# Patient Record
Sex: Female | Born: 1985 | Race: Black or African American | Hispanic: No | Marital: Single | Smoking: Former smoker
Health system: Southern US, Community
[De-identification: ages and names within clinical notes are randomized; demographics above are authoritative.]

## PROBLEM LIST (undated history)

## (undated) DIAGNOSIS — B9689 Other specified bacterial agents as the cause of diseases classified elsewhere: Secondary | ICD-10-CM

## (undated) DIAGNOSIS — G43909 Migraine, unspecified, not intractable, without status migrainosus: Secondary | ICD-10-CM

## (undated) DIAGNOSIS — Z9109 Other allergy status, other than to drugs and biological substances: Secondary | ICD-10-CM

## (undated) DIAGNOSIS — A749 Chlamydial infection, unspecified: Secondary | ICD-10-CM

## (undated) DIAGNOSIS — B3731 Acute candidiasis of vulva and vagina: Secondary | ICD-10-CM

## (undated) DIAGNOSIS — B373 Candidiasis of vulva and vagina: Secondary | ICD-10-CM

## (undated) DIAGNOSIS — N760 Acute vaginitis: Secondary | ICD-10-CM

## (undated) DIAGNOSIS — A549 Gonococcal infection, unspecified: Secondary | ICD-10-CM

## (undated) DIAGNOSIS — I10 Essential (primary) hypertension: Secondary | ICD-10-CM

## (undated) DIAGNOSIS — R87619 Unspecified abnormal cytological findings in specimens from cervix uteri: Secondary | ICD-10-CM

## (undated) DIAGNOSIS — IMO0002 Reserved for concepts with insufficient information to code with codable children: Secondary | ICD-10-CM

---
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
## 2007-03-06 ENCOUNTER — Emergency Department (HOSPITAL_COMMUNITY): Admission: EM | Admit: 2007-03-06 | Discharge: 2007-03-06 | Payer: Self-pay | Admitting: Family Medicine

## 2007-03-09 ENCOUNTER — Inpatient Hospital Stay (HOSPITAL_COMMUNITY): Admission: AD | Admit: 2007-03-09 | Discharge: 2007-03-09 | Payer: Self-pay | Admitting: Obstetrics and Gynecology

## 2007-11-01 ENCOUNTER — Inpatient Hospital Stay (HOSPITAL_COMMUNITY): Admission: AD | Admit: 2007-11-01 | Discharge: 2007-11-01 | Payer: Self-pay | Admitting: Obstetrics & Gynecology

## 2007-11-01 ENCOUNTER — Ambulatory Visit: Payer: Self-pay | Admitting: Advanced Practice Midwife

## 2008-02-14 ENCOUNTER — Emergency Department (HOSPITAL_COMMUNITY): Admission: EM | Admit: 2008-02-14 | Discharge: 2008-02-14 | Payer: Self-pay | Admitting: Emergency Medicine

## 2008-07-15 ENCOUNTER — Emergency Department (HOSPITAL_COMMUNITY): Admission: EM | Admit: 2008-07-15 | Discharge: 2008-07-15 | Payer: Self-pay | Admitting: Emergency Medicine

## 2009-01-01 ENCOUNTER — Emergency Department (HOSPITAL_COMMUNITY): Admission: EM | Admit: 2009-01-01 | Discharge: 2009-01-01 | Payer: Self-pay | Admitting: Family Medicine

## 2009-01-06 ENCOUNTER — Inpatient Hospital Stay (HOSPITAL_COMMUNITY): Admission: AD | Admit: 2009-01-06 | Discharge: 2009-01-06 | Payer: Self-pay | Admitting: Obstetrics & Gynecology

## 2009-02-04 ENCOUNTER — Emergency Department (HOSPITAL_COMMUNITY): Admission: EM | Admit: 2009-02-04 | Discharge: 2009-02-04 | Payer: Self-pay | Admitting: Emergency Medicine

## 2009-03-30 ENCOUNTER — Emergency Department (HOSPITAL_COMMUNITY): Admission: EM | Admit: 2009-03-30 | Discharge: 2009-03-30 | Payer: Self-pay | Admitting: Family Medicine

## 2009-04-30 ENCOUNTER — Emergency Department (HOSPITAL_COMMUNITY): Admission: EM | Admit: 2009-04-30 | Discharge: 2009-04-30 | Payer: Self-pay | Admitting: Family Medicine

## 2009-05-24 ENCOUNTER — Emergency Department (HOSPITAL_COMMUNITY): Admission: EM | Admit: 2009-05-24 | Discharge: 2009-05-24 | Payer: Self-pay | Admitting: Emergency Medicine

## 2009-09-02 ENCOUNTER — Emergency Department (HOSPITAL_COMMUNITY): Admission: EM | Admit: 2009-09-02 | Discharge: 2009-09-02 | Payer: Self-pay | Admitting: Emergency Medicine

## 2010-03-01 ENCOUNTER — Emergency Department (HOSPITAL_COMMUNITY): Admission: EM | Admit: 2010-03-01 | Discharge: 2010-03-01 | Payer: Self-pay | Admitting: Family Medicine

## 2010-03-04 ENCOUNTER — Emergency Department (HOSPITAL_COMMUNITY): Admission: EM | Admit: 2010-03-04 | Discharge: 2010-03-04 | Payer: Self-pay | Admitting: Family Medicine

## 2010-03-31 ENCOUNTER — Emergency Department (HOSPITAL_COMMUNITY): Admission: EM | Admit: 2010-03-31 | Discharge: 2010-03-31 | Payer: Self-pay | Admitting: Family Medicine

## 2010-06-18 ENCOUNTER — Emergency Department (HOSPITAL_COMMUNITY): Admission: EM | Admit: 2010-06-18 | Discharge: 2010-06-18 | Payer: Self-pay | Admitting: Family Medicine

## 2010-07-26 ENCOUNTER — Emergency Department (HOSPITAL_COMMUNITY)
Admission: EM | Admit: 2010-07-26 | Discharge: 2010-07-26 | Payer: Self-pay | Source: Home / Self Care | Admitting: Emergency Medicine

## 2010-09-01 ENCOUNTER — Emergency Department (HOSPITAL_COMMUNITY)
Admission: EM | Admit: 2010-09-01 | Discharge: 2010-09-01 | Payer: Self-pay | Source: Home / Self Care | Admitting: Emergency Medicine

## 2010-09-03 ENCOUNTER — Emergency Department (HOSPITAL_COMMUNITY)
Admission: EM | Admit: 2010-09-03 | Discharge: 2010-09-03 | Payer: Self-pay | Source: Home / Self Care | Admitting: Family Medicine

## 2010-09-03 LAB — POCT URINALYSIS DIPSTICK
Specific Gravity, Urine: 1.02 (ref 1.005–1.030)
Urine Glucose, Fasting: NEGATIVE mg/dL
Urobilinogen, UA: 1 mg/dL (ref 0.0–1.0)

## 2010-09-03 LAB — WET PREP, GENITAL

## 2010-09-03 LAB — POCT PREGNANCY, URINE: Preg Test, Ur: NEGATIVE

## 2010-09-30 ENCOUNTER — Inpatient Hospital Stay (INDEPENDENT_AMBULATORY_CARE_PROVIDER_SITE_OTHER)
Admission: RE | Admit: 2010-09-30 | Discharge: 2010-09-30 | Disposition: A | Discharge status: Home / Self Care | Payer: Self-pay | Source: Ambulatory Visit | Attending: Family Medicine | Admitting: Family Medicine

## 2010-09-30 DIAGNOSIS — A499 Bacterial infection, unspecified: Secondary | ICD-10-CM

## 2010-09-30 DIAGNOSIS — N760 Acute vaginitis: Secondary | ICD-10-CM

## 2010-09-30 LAB — POCT URINALYSIS DIPSTICK
Bilirubin Urine: NEGATIVE
Ketones, ur: NEGATIVE mg/dL
Nitrite: NEGATIVE
Specific Gravity, Urine: 1.02 (ref 1.005–1.030)
pH: 5.5 (ref 5.0–8.0)

## 2010-09-30 LAB — WET PREP, GENITAL: Trich, Wet Prep: NONE SEEN

## 2010-10-01 LAB — GC/CHLAMYDIA PROBE AMP, GENITAL
Chlamydia, DNA Probe: NEGATIVE
GC Probe Amp, Genital: NEGATIVE

## 2010-10-19 LAB — POCT URINALYSIS DIPSTICK
Nitrite: NEGATIVE
Protein, ur: NEGATIVE mg/dL
Urobilinogen, UA: 0.2 mg/dL (ref 0.0–1.0)
pH: 6 (ref 5.0–8.0)

## 2010-10-19 LAB — POCT PREGNANCY, URINE: Preg Test, Ur: NEGATIVE

## 2010-10-19 LAB — WET PREP, GENITAL
Trich, Wet Prep: NONE SEEN
Yeast Wet Prep HPF POC: NONE SEEN

## 2010-10-22 LAB — URINE CULTURE

## 2010-10-22 LAB — POCT URINALYSIS DIPSTICK
Bilirubin Urine: NEGATIVE
Glucose, UA: NEGATIVE mg/dL
Nitrite: NEGATIVE
Urobilinogen, UA: 0.2 mg/dL (ref 0.0–1.0)

## 2010-10-23 LAB — POCT PREGNANCY, URINE
Preg Test, Ur: NEGATIVE
Preg Test, Ur: NEGATIVE

## 2010-10-23 LAB — POCT URINALYSIS DIP (DEVICE)
Bilirubin Urine: NEGATIVE
Glucose, UA: NEGATIVE mg/dL
Hgb urine dipstick: NEGATIVE
Nitrite: NEGATIVE
Urobilinogen, UA: 1 mg/dL (ref 0.0–1.0)

## 2010-10-23 LAB — WET PREP, GENITAL
Trich, Wet Prep: NONE SEEN
Yeast Wet Prep HPF POC: NONE SEEN

## 2010-10-23 LAB — GC/CHLAMYDIA PROBE AMP, GENITAL: GC Probe Amp, Genital: NEGATIVE

## 2010-10-24 LAB — RPR: RPR Ser Ql: NONREACTIVE

## 2010-10-24 LAB — POCT URINALYSIS DIP (DEVICE)
Bilirubin Urine: NEGATIVE
Ketones, ur: NEGATIVE mg/dL

## 2010-10-24 LAB — POCT PREGNANCY, URINE: Preg Test, Ur: NEGATIVE

## 2010-10-24 LAB — WET PREP, GENITAL

## 2010-10-24 LAB — GC/CHLAMYDIA PROBE AMP, GENITAL: GC Probe Amp, Genital: NEGATIVE

## 2010-10-31 ENCOUNTER — Emergency Department (HOSPITAL_COMMUNITY)
Admission: EM | Admit: 2010-10-31 | Discharge: 2010-10-31 | Disposition: A | Discharge status: Home / Self Care | Payer: Medicaid Other | Attending: Emergency Medicine | Admitting: Emergency Medicine

## 2010-10-31 DIAGNOSIS — S61209A Unspecified open wound of unspecified finger without damage to nail, initial encounter: Secondary | ICD-10-CM | POA: Insufficient documentation

## 2010-11-04 ENCOUNTER — Inpatient Hospital Stay (INDEPENDENT_AMBULATORY_CARE_PROVIDER_SITE_OTHER)
Admission: RE | Admit: 2010-11-04 | Discharge: 2010-11-04 | Disposition: A | Discharge status: Home / Self Care | Payer: Self-pay | Source: Ambulatory Visit | Attending: Family Medicine | Admitting: Family Medicine

## 2010-11-04 DIAGNOSIS — N760 Acute vaginitis: Secondary | ICD-10-CM

## 2010-11-04 LAB — POCT URINALYSIS DIP (DEVICE)
Bilirubin Urine: NEGATIVE
Bilirubin Urine: NEGATIVE
Glucose, UA: NEGATIVE mg/dL
Hgb urine dipstick: NEGATIVE
Ketones, ur: NEGATIVE mg/dL
Ketones, ur: NEGATIVE mg/dL
pH: 7 (ref 5.0–8.0)
pH: 7 (ref 5.0–8.0)

## 2010-11-04 LAB — WET PREP, GENITAL: Trich, Wet Prep: NONE SEEN

## 2010-11-11 LAB — WET PREP, GENITAL: WBC, Wet Prep HPF POC: NONE SEEN

## 2010-11-11 LAB — POCT URINALYSIS DIP (DEVICE)
Glucose, UA: NEGATIVE mg/dL
Hgb urine dipstick: NEGATIVE
Nitrite: NEGATIVE
Protein, ur: NEGATIVE mg/dL
Urobilinogen, UA: 1 mg/dL (ref 0.0–1.0)
pH: 8.5 — ABNORMAL HIGH (ref 5.0–8.0)

## 2010-11-11 LAB — POCT PREGNANCY, URINE: Preg Test, Ur: NEGATIVE

## 2010-11-12 LAB — POCT URINALYSIS DIP (DEVICE)
Bilirubin Urine: NEGATIVE
Glucose, UA: NEGATIVE mg/dL
Ketones, ur: NEGATIVE mg/dL
Nitrite: NEGATIVE

## 2010-11-12 LAB — POCT PREGNANCY, URINE: Preg Test, Ur: NEGATIVE

## 2010-11-12 LAB — WET PREP, GENITAL
WBC, Wet Prep HPF POC: NONE SEEN
Yeast Wet Prep HPF POC: NONE SEEN

## 2010-11-12 LAB — GC/CHLAMYDIA PROBE AMP, GENITAL: Chlamydia, DNA Probe: POSITIVE — AB

## 2010-11-13 LAB — POCT URINALYSIS DIP (DEVICE)
Bilirubin Urine: NEGATIVE
Glucose, UA: NEGATIVE mg/dL
Nitrite: NEGATIVE
pH: 6.5 (ref 5.0–8.0)

## 2010-11-13 LAB — GC/CHLAMYDIA PROBE AMP, GENITAL: Chlamydia, DNA Probe: NEGATIVE

## 2010-11-13 LAB — POCT PREGNANCY, URINE: Preg Test, Ur: NEGATIVE

## 2010-11-13 LAB — WET PREP, GENITAL: Trich, Wet Prep: NONE SEEN

## 2010-11-15 LAB — URINALYSIS, ROUTINE W REFLEX MICROSCOPIC
Protein, ur: 100 mg/dL — AB
Urobilinogen, UA: 0.2 mg/dL (ref 0.0–1.0)

## 2010-11-15 LAB — POCT URINALYSIS DIP (DEVICE)
Bilirubin Urine: NEGATIVE
Ketones, ur: NEGATIVE mg/dL
pH: 6 (ref 5.0–8.0)

## 2010-11-15 LAB — CBC
HCT: 38.5 % (ref 36.0–46.0)
MCV: 87.3 fL (ref 78.0–100.0)
Platelets: 253 10*3/uL (ref 150–400)
WBC: 7.5 10*3/uL (ref 4.0–10.5)

## 2010-11-15 LAB — WET PREP, GENITAL: Trich, Wet Prep: NONE SEEN

## 2010-11-15 LAB — URINE MICROSCOPIC-ADD ON

## 2010-11-15 LAB — GC/CHLAMYDIA PROBE AMP, GENITAL: GC Probe Amp, Genital: NEGATIVE

## 2010-11-16 LAB — POCT URINALYSIS DIP (DEVICE)
Hgb urine dipstick: NEGATIVE
Ketones, ur: NEGATIVE mg/dL
Nitrite: NEGATIVE
pH: 5.5 (ref 5.0–8.0)

## 2010-11-16 LAB — WET PREP, GENITAL
Trich, Wet Prep: NONE SEEN
Yeast Wet Prep HPF POC: NONE SEEN

## 2010-11-16 LAB — GC/CHLAMYDIA PROBE AMP, GENITAL: Chlamydia, DNA Probe: NEGATIVE

## 2010-12-24 NOTE — Group Therapy Note (Signed)
Jillian Rowland, Jillian Rowland            ACCOUNT NO.:  [id number]   MEDICAL RECORD NO.:  [id number]          PATIENT TYPE:  WOC   LOCATION:  [facility]                   FACILITY:  [facility]   PHYSICIAN:  Tinnie Gens, MD        DATE OF BIRTH:  June 19, 1986   DATE OF SERVICE:  09/03/2004                                    CLINIC NOTE   CHIEF COMPLAINT:  Referral for amenorrhea.   HISTORY OF PRESENT ILLNESS:  Patient is a 25 year old G0 who is referred  from the health department.  She was seen there on December 12 for birth  control consult.  However, she had irregular cycles and she was referred  here for that.  According to the patient she had menarche in July of 2005  for the first time with normal breast development.  Her next cycle was in  September of 2005 and she had one in December of 2005.  She had yet to have  one in January.   PAST MEDICAL HISTORY:  Negative.   PAST SURGICAL HISTORY:  Negative.   PAST OBSTETRICAL HISTORY:  G0.   PAST GYNECOLOGICAL HISTORY:  No abnormal Pap smear.  First intercourse age  20.   MEDICATIONS:  None.   ALLERGIES:  None known.   FAMILY HISTORY:  Diabetes, hypertension, breast cancer.   SOCIAL HISTORY:  No tobacco, alcohol, or drug use.  Still student.  She does  have occasional headaches.   PHYSICAL EXAMINATION:  GENERAL:  She is a well-developed, well-nourished,  typical looking female.  VITAL SIGNS:  Stated in the chart.  ABDOMEN:  Soft, nontender, nondistended.   IMPRESSION:  1.  Oligomenorrhea likely related to this being the first year of her      cycles.  This is not unusual for kids to have irregular cycles for the      first year after menarche.  Otherwise, normal pubertal development.  2.  Birth control consult.   PLAN:  I believe this is normal for her.  She has no other symptoms.  She  does need birth control.  Have started her on the ring.  She was instructed  about its usage and how to obtain it.  Patient will go back to the  health  department  for assistance with the NuvaRing.  When the patient comes off birth control  should she continue to have irregular cycles, will be happy to see her back  for that.      TP/MEDQ  D:  09/03/2004  T:  09/03/2004  Job:  [id number]

## 2011-01-30 ENCOUNTER — Inpatient Hospital Stay (INDEPENDENT_AMBULATORY_CARE_PROVIDER_SITE_OTHER)
Admission: RE | Admit: 2011-01-30 | Discharge: 2011-01-30 | Disposition: A | Discharge status: Home / Self Care | Payer: Self-pay | Source: Ambulatory Visit | Attending: Emergency Medicine | Admitting: Emergency Medicine

## 2011-01-30 DIAGNOSIS — L740 Miliaria rubra: Secondary | ICD-10-CM

## 2011-01-30 DIAGNOSIS — N760 Acute vaginitis: Secondary | ICD-10-CM

## 2011-01-30 LAB — POCT PREGNANCY, URINE: Preg Test, Ur: NEGATIVE

## 2011-01-30 LAB — POCT URINALYSIS DIP (DEVICE)
Leukocytes, UA: NEGATIVE
Nitrite: POSITIVE — AB
Protein, ur: NEGATIVE mg/dL
Urobilinogen, UA: 1 mg/dL (ref 0.0–1.0)

## 2011-02-01 LAB — GC/CHLAMYDIA PROBE AMP, GENITAL
Chlamydia, DNA Probe: NEGATIVE
GC Probe Amp, Genital: NEGATIVE

## 2011-02-21 ENCOUNTER — Inpatient Hospital Stay (INDEPENDENT_AMBULATORY_CARE_PROVIDER_SITE_OTHER)
Admission: RE | Admit: 2011-02-21 | Discharge: 2011-02-21 | Disposition: A | Discharge status: Home / Self Care | Payer: Medicaid Other | Source: Ambulatory Visit | Attending: Family Medicine | Admitting: Family Medicine

## 2011-02-21 DIAGNOSIS — H109 Unspecified conjunctivitis: Secondary | ICD-10-CM

## 2011-03-27 ENCOUNTER — Inpatient Hospital Stay (INDEPENDENT_AMBULATORY_CARE_PROVIDER_SITE_OTHER)
Admission: RE | Admit: 2011-03-27 | Discharge: 2011-03-27 | Disposition: A | Discharge status: Home / Self Care | Payer: Medicaid Other | Source: Ambulatory Visit | Attending: Emergency Medicine | Admitting: Emergency Medicine

## 2011-03-27 DIAGNOSIS — N760 Acute vaginitis: Secondary | ICD-10-CM

## 2011-03-27 LAB — WET PREP, GENITAL
Clue Cells Wet Prep HPF POC: NONE SEEN
Trich, Wet Prep: NONE SEEN

## 2011-03-27 LAB — POCT URINALYSIS DIP (DEVICE)
Bilirubin Urine: NEGATIVE
Ketones, ur: NEGATIVE mg/dL
Nitrite: NEGATIVE
Protein, ur: NEGATIVE mg/dL

## 2011-03-27 LAB — POCT PREGNANCY, URINE: Preg Test, Ur: NEGATIVE

## 2011-03-28 LAB — GC/CHLAMYDIA PROBE AMP, GENITAL: GC Probe Amp, Genital: NEGATIVE

## 2011-05-02 ENCOUNTER — Inpatient Hospital Stay (INDEPENDENT_AMBULATORY_CARE_PROVIDER_SITE_OTHER)
Admission: RE | Admit: 2011-05-02 | Discharge: 2011-05-02 | Disposition: A | Discharge status: Home / Self Care | Payer: Medicaid Other | Source: Ambulatory Visit | Attending: Family Medicine | Admitting: Family Medicine

## 2011-05-02 DIAGNOSIS — N760 Acute vaginitis: Secondary | ICD-10-CM

## 2011-05-02 DIAGNOSIS — A499 Bacterial infection, unspecified: Secondary | ICD-10-CM

## 2011-05-02 LAB — POCT URINALYSIS DIP (DEVICE)
Bilirubin Urine: NEGATIVE
Glucose, UA: NEGATIVE mg/dL
Ketones, ur: NEGATIVE mg/dL
Leukocytes, UA: NEGATIVE
Nitrite: NEGATIVE

## 2011-05-02 LAB — WET PREP, GENITAL

## 2011-05-03 LAB — GC/CHLAMYDIA PROBE AMP, GENITAL: GC Probe Amp, Genital: NEGATIVE

## 2011-05-05 LAB — WET PREP, GENITAL
WBC, Wet Prep HPF POC: NONE SEEN
Yeast Wet Prep HPF POC: NONE SEEN

## 2011-05-05 LAB — POCT URINALYSIS DIP (DEVICE)
Bilirubin Urine: NEGATIVE
Hgb urine dipstick: NEGATIVE
Nitrite: NEGATIVE
Specific Gravity, Urine: 1.015
pH: 6.5

## 2011-05-05 LAB — POCT PREGNANCY, URINE: Preg Test, Ur: NEGATIVE

## 2011-05-05 LAB — POCT RAPID STREP A: Streptococcus, Group A Screen (Direct): NEGATIVE

## 2011-05-13 LAB — HERPES SIMPLEX VIRUS CULTURE

## 2011-05-13 LAB — GC/CHLAMYDIA PROBE AMP, GENITAL
Chlamydia, DNA Probe: NEGATIVE
GC Probe Amp, Genital: NEGATIVE

## 2011-05-13 LAB — WET PREP, GENITAL

## 2011-05-13 LAB — RPR: RPR Ser Ql: NONREACTIVE

## 2011-05-13 LAB — POCT URINALYSIS DIP (DEVICE)
Glucose, UA: NEGATIVE mg/dL
Nitrite: NEGATIVE
Specific Gravity, Urine: 1.015 (ref 1.005–1.030)
Urobilinogen, UA: 0.2 mg/dL (ref 0.0–1.0)

## 2011-05-13 LAB — POCT PREGNANCY, URINE: Preg Test, Ur: NEGATIVE

## 2011-05-13 LAB — HIV ANTIBODY (ROUTINE TESTING W REFLEX): HIV: NONREACTIVE

## 2011-05-23 LAB — URINALYSIS, ROUTINE W REFLEX MICROSCOPIC
Bilirubin Urine: NEGATIVE
Glucose, UA: NEGATIVE
Hgb urine dipstick: NEGATIVE
Ketones, ur: NEGATIVE
Protein, ur: NEGATIVE
pH: 7

## 2011-05-23 LAB — WET PREP, GENITAL

## 2011-05-23 LAB — GC/CHLAMYDIA PROBE AMP, GENITAL
Chlamydia, DNA Probe: NEGATIVE
GC Probe Amp, Genital: NEGATIVE

## 2011-05-23 LAB — POCT URINALYSIS DIP (DEVICE)
Bilirubin Urine: NEGATIVE
Nitrite: NEGATIVE
Protein, ur: NEGATIVE
pH: 6

## 2011-05-23 LAB — POCT PREGNANCY, URINE
Operator id: 235561
Preg Test, Ur: NEGATIVE

## 2011-05-31 ENCOUNTER — Inpatient Hospital Stay (INDEPENDENT_AMBULATORY_CARE_PROVIDER_SITE_OTHER)
Admission: RE | Admit: 2011-05-31 | Discharge: 2011-05-31 | Disposition: A | Discharge status: Home / Self Care | Payer: Medicaid Other | Source: Ambulatory Visit | Attending: Emergency Medicine | Admitting: Emergency Medicine

## 2011-05-31 DIAGNOSIS — J020 Streptococcal pharyngitis: Secondary | ICD-10-CM

## 2011-05-31 DIAGNOSIS — N912 Amenorrhea, unspecified: Secondary | ICD-10-CM

## 2011-05-31 LAB — POCT RAPID STREP A: Streptococcus, Group A Screen (Direct): POSITIVE — AB

## 2011-05-31 LAB — POCT PREGNANCY, URINE: Preg Test, Ur: NEGATIVE

## 2011-06-15 ENCOUNTER — Emergency Department (HOSPITAL_COMMUNITY): Payer: Medicaid Other

## 2011-06-15 ENCOUNTER — Emergency Department (HOSPITAL_COMMUNITY)
Admission: EM | Admit: 2011-06-15 | Discharge: 2011-06-15 | Disposition: A | Discharge status: Home / Self Care | Payer: Medicaid Other | Attending: Emergency Medicine | Admitting: Emergency Medicine

## 2011-06-15 ENCOUNTER — Encounter: Payer: Self-pay | Admitting: Adult Health

## 2011-06-15 DIAGNOSIS — S93401A Sprain of unspecified ligament of right ankle, initial encounter: Secondary | ICD-10-CM

## 2011-06-15 DIAGNOSIS — S93409A Sprain of unspecified ligament of unspecified ankle, initial encounter: Secondary | ICD-10-CM | POA: Insufficient documentation

## 2011-06-15 DIAGNOSIS — W010XXA Fall on same level from slipping, tripping and stumbling without subsequent striking against object, initial encounter: Secondary | ICD-10-CM | POA: Insufficient documentation

## 2011-06-15 DIAGNOSIS — Y92009 Unspecified place in unspecified non-institutional (private) residence as the place of occurrence of the external cause: Secondary | ICD-10-CM | POA: Insufficient documentation

## 2011-06-15 DIAGNOSIS — M25579 Pain in unspecified ankle and joints of unspecified foot: Secondary | ICD-10-CM | POA: Insufficient documentation

## 2011-06-15 MED ORDER — TRAMADOL HCL 50 MG PO TABS: 50.0000 mg | ORAL_TABLET | Freq: Four times a day (QID) | ORAL | Status: AC | PRN

## 2011-06-15 MED ORDER — NAPROXEN 500 MG PO TABS: 500.0000 mg | ORAL_TABLET | Freq: Two times a day (BID) | ORAL | Status: DC

## 2011-06-15 NOTE — ED Provider Notes (Signed)
History     CSN: [id number] Arrival date & time: 06/15/2011 12:08 PM   First MD Initiated Contact with Patient 06/15/11 1248      Chief Complaint  Patient presents with  . Ankle Pain    (Consider location/radiation/quality/duration/timing/severity/associated sxs/prior treatment) Patient is a 25 y.o. female presenting with ankle pain. The history is provided by the patient.  Ankle Pain  The incident occurred yesterday. The incident occurred at home. The injury mechanism was a fall. The pain is present in the right ankle. The pain is at a severity of 6/10. The pain is mild. The pain has been constant since onset. Associated symptoms include inability to bear weight and loss of motion. Pertinent negatives include no numbness, no loss of sensation and no tingling. The symptoms are aggravated by activity, bearing weight and palpation. She has tried nothing for the symptoms.    History reviewed. No pertinent past medical history.  Past Surgical History  Procedure Date  . Cesarean section     Family History  Problem Relation Age of Onset  . Asthma Mother   . Diabetes Father   . Hypertension Father     History  Substance Use Topics  . Smoking status: Current Everyday Smoker -- 0.5 packs/day for 3 years    Types: Cigarettes  . Smokeless tobacco: Not on file  . Alcohol Use: Yes    OB History    Grav Para Term Preterm Abortions TAB SAB Ect Mult Living                  Review of Systems  Constitutional: Negative.   Respiratory: Negative.   Cardiovascular: Negative.   Musculoskeletal: Positive for joint swelling and arthralgias.  Skin: Negative.   Neurological: Negative.  Negative for tingling and numbness.  Psychiatric/Behavioral: Negative.     Allergies  Review of patient's allergies indicates no known allergies.  Home Medications   Current Outpatient Rx  Name Route Sig Dispense Refill  . NAPROXEN 500 MG PO TABS Oral Take 1 tablet (500 mg total) by mouth 2 (two)  times daily. 30 tablet 0  . TRAMADOL HCL 50 MG PO TABS Oral Take 1 tablet (50 mg total) by mouth every 6 (six) hours as needed for pain. Maximum dose= 8 tablets per day 15 tablet 0    BP 140/77  Pulse 80  Temp(Src) 98.4 F (36.9 C) (Oral)  Resp 16  SpO2 99%  LMP 06/14/2011  Physical Exam  Nursing note and vitals reviewed. Constitutional: She is oriented to person, place, and time. She appears well-developed and well-nourished. No distress.  HENT:  Head: Normocephalic and atraumatic.  Cardiovascular: Normal rate, regular rhythm and normal heart sounds.   Pulmonary/Chest: Effort normal and breath sounds normal.  Musculoskeletal:       Right ankle: She exhibits decreased range of motion and swelling. She exhibits no ecchymosis, no deformity and normal pulse. tenderness. Lateral malleolus and medial malleolus tenderness found. No AITFL, no CF ligament, no posterior TFL, no head of 5th metatarsal and no proximal fibula tenderness found. Achilles tendon normal.  Neurological: She is alert and oriented to person, place, and time.  Skin: Skin is warm and dry.  Psychiatric: She has a normal mood and affect.    ED Course  Procedures (including critical care time)  Labs Reviewed - No data to display Dg Ankle Complete Right  06/15/2011  *RADIOLOGY REPORT*  Clinical Data: Pain after twisting injury  RIGHT ANKLE - COMPLETE 3+ VIEW  Comparison: None.  Findings: Ankle mortise intact. Negative for fracture, dislocation, or other acute abnormality.  Normal alignment and mineralization. No significant degenerative change.  Regional soft tissues unremarkable.  IMPRESSION:  Negative  Original Report Authenticated By: Osa Craver, M.D.   Negative right ankle x-ray. ASO applied, crutches given. Joint stable. Will d/c home with orthopedics follow up.  1. Right ankle sprain       MDM          Lottie Mussel, PA 06/15/11 1622

## 2011-06-15 NOTE — Progress Notes (Signed)
Orthopedic Tech Progress Note Patient Details:  Jillian Rowland [birth year] [id number]  Type of Splint: ASO Splint Location: Right Foot Splint Interventions: Application    Tawni Carnes [facility] 06/15/2011, 1:30 PM

## 2011-06-15 NOTE — ED Notes (Signed)
Pt slipped on leaves and fell last night injuring right ankle

## 2011-06-16 ENCOUNTER — Encounter (HOSPITAL_COMMUNITY): Payer: Self-pay | Admitting: Emergency Medicine

## 2011-06-16 NOTE — ED Provider Notes (Signed)
Medical screening examination/treatment/procedure(s) were performed by non-physician practitioner and as supervising physician I was immediately available for consultation/collaboration.  Raeford Razor, MD 06/16/11 [phone]

## 2011-07-11 ENCOUNTER — Emergency Department (HOSPITAL_COMMUNITY)
Admission: EM | Admit: 2011-07-11 | Discharge: 2011-07-11 | Discharge status: Against Medical Advice | Payer: Medicaid Other | Attending: Emergency Medicine | Admitting: Emergency Medicine

## 2011-07-11 DIAGNOSIS — R6889 Other general symptoms and signs: Secondary | ICD-10-CM | POA: Insufficient documentation

## 2011-07-30 ENCOUNTER — Encounter (HOSPITAL_COMMUNITY): Payer: Self-pay | Admitting: *Deleted

## 2011-07-30 ENCOUNTER — Emergency Department (HOSPITAL_COMMUNITY)
Admission: EM | Admit: 2011-07-30 | Discharge: 2011-07-30 | Disposition: A | Discharge status: Home / Self Care | Payer: Medicaid Other | Attending: Emergency Medicine | Admitting: Emergency Medicine

## 2011-07-30 ENCOUNTER — Emergency Department (HOSPITAL_COMMUNITY)
Admission: EM | Admit: 2011-07-30 | Discharge: 2011-07-30 | Disposition: A | Discharge status: Home / Self Care | Payer: Medicaid Other | Source: Home / Self Care | Attending: Emergency Medicine | Admitting: Emergency Medicine

## 2011-07-30 DIAGNOSIS — L03119 Cellulitis of unspecified part of limb: Secondary | ICD-10-CM | POA: Insufficient documentation

## 2011-07-30 DIAGNOSIS — L02419 Cutaneous abscess of limb, unspecified: Secondary | ICD-10-CM | POA: Insufficient documentation

## 2011-07-30 DIAGNOSIS — L0390 Cellulitis, unspecified: Secondary | ICD-10-CM

## 2011-07-30 DIAGNOSIS — F17200 Nicotine dependence, unspecified, uncomplicated: Secondary | ICD-10-CM | POA: Insufficient documentation

## 2011-07-30 MED ORDER — LIDOCAINE-EPINEPHRINE (PF) 1 %-1:200000 IJ SOLN
INTRAMUSCULAR | Status: AC
  Administered 2011-07-30: 22:00:00
  Filled 2011-07-30: qty 10

## 2011-07-30 MED ORDER — DOXYCYCLINE HYCLATE 100 MG PO CAPS: 100.0000 mg | ORAL_CAPSULE | Freq: Two times a day (BID) | ORAL | Status: AC

## 2011-07-30 NOTE — ED Provider Notes (Signed)
History     CSN: [id number]  Arrival date & time 07/30/11  9147   First MD Initiated Contact with Patient 07/30/11 2004      Chief Complaint  Patient presents with  . Abscess    R lateral calf    (Consider location/radiation/quality/duration/timing/severity/associated sxs/prior treatment) HPI Comments: Patient first noticed two separate abscesses on her right lower leg last evening. She reports that the areas are red and tender.  No drainage from the area.  No fevers.  No other symptoms.  No prior history of an abscess.  She is not diabetic.  Patient is a 25 y.o. female presenting with abscess. The history is provided by the patient.  Abscess  This is a new problem. The current episode started yesterday. The onset was gradual. The abscess is present on the right lower leg. The problem is mild. The abscess is characterized by redness. Pertinent negatives include no anorexia, no fever, no diarrhea, no vomiting, no congestion, no rhinorrhea, no sore throat and no cough. There were no sick contacts. She has received no recent medical care.    No past medical history on file.  Past Surgical History  Procedure Date  . Cesarean section     Family History  Problem Relation Age of Onset  . Asthma Mother   . Diabetes Father   . Hypertension Father     History  Substance Use Topics  . Smoking status: Current Everyday Smoker -- 0.5 packs/day for 3 years    Types: Cigarettes  . Smokeless tobacco: Not on file  . Alcohol Use: Yes    OB History    Grav Para Term Preterm Abortions TAB SAB Ect Mult Living                  Review of Systems  Constitutional: Negative for fever, chills and appetite change.  HENT: Negative for congestion, sore throat, rhinorrhea, neck pain and neck stiffness.   Respiratory: Negative for cough and shortness of breath.   Gastrointestinal: Negative for nausea, vomiting, abdominal pain, diarrhea and anorexia.  Neurological: Negative for dizziness,  syncope, light-headedness and headaches.    Allergies  Review of patient's allergies indicates no known allergies.  Home Medications   Current Outpatient Rx  Name Route Sig Dispense Refill  . NAPROXEN 500 MG PO TABS Oral Take 1 tablet (500 mg total) by mouth 2 (two) times daily. 30 tablet 0    BP 137/78  Pulse 91  Temp(Src) 99 F (37.2 C) (Oral)  Resp 18  Ht 5\' 5"  (1.651 m)  Wt 222 lb 9.6 oz (100.971 kg)  BMI 37.04 kg/m2  SpO2 97%  Physical Exam  Nursing note and vitals reviewed. Constitutional: She is oriented to person, place, and time. She appears well-developed and well-nourished. No distress.  HENT:  Head: Normocephalic and atraumatic.  Cardiovascular: Normal rate, regular rhythm and normal heart sounds.   Pulmonary/Chest: Effort normal and breath sounds normal.  Musculoskeletal: Normal range of motion.  Neurological: She is alert and oriented to person, place, and time.  Skin: Skin is warm and dry. She is not diaphoretic.     Psychiatric: She has a normal mood and affect.    ED Course  Procedures (including critical care time)  Labs Reviewed - No data to display No results found.   No diagnosis found.  INCISION AND DRAINAGE Performed by: Anne Shutter, Rapheal Masso Consent: Verbal consent obtained. Risks and benefits: risks, benefits and alternatives were discussed Type: abscess  Body area: right  lower leg  Anesthesia: local infiltration  Local anesthetic: lidocaine 1% with epinephrine  Anesthetic total: 2 ml  Complexity: complex Blunt dissection to break up loculations  Drainage: purulent  Drainage amount: small  Patient tolerance: Patient tolerated the procedure well with no immediate complications.    MDM  Incision and drainage of the abscesses in the ED.  Patient given Rx for antibiotic due to the surrounding erythema and induration.  Area of cellulitis minimal.  Patient afebrile.        Pascal Lux [facility] 08/02/11 1402

## 2011-07-30 NOTE — ED Notes (Signed)
MD at bedside. Lidocaine and I/D tray given per request.

## 2011-07-30 NOTE — ED Notes (Signed)
Pt noticed a swelling on her right lateral calf yesterday evening.  Said swollen area appears to be about the size of a quarter with a small papular head in the middle.

## 2011-08-02 NOTE — ED Provider Notes (Signed)
Medical screening examination/treatment/procedure(s) were performed by non-physician practitioner and as supervising physician I was immediately available for consultation/collaboration.   Nat Christen, MD 08/02/11 [phone]

## 2011-08-16 ENCOUNTER — Emergency Department (INDEPENDENT_AMBULATORY_CARE_PROVIDER_SITE_OTHER)
Admission: EM | Admit: 2011-08-16 | Discharge: 2011-08-16 | Disposition: A | Discharge status: Home / Self Care | Payer: Medicaid Other | Source: Home / Self Care | Attending: Family Medicine | Admitting: Family Medicine

## 2011-08-16 ENCOUNTER — Encounter (HOSPITAL_COMMUNITY): Payer: Self-pay | Admitting: *Deleted

## 2011-08-16 DIAGNOSIS — J329 Chronic sinusitis, unspecified: Secondary | ICD-10-CM

## 2011-08-16 LAB — POCT PREGNANCY, URINE: Preg Test, Ur: NEGATIVE

## 2011-08-16 MED ORDER — IBUPROFEN 800 MG PO TABS: 800.0000 mg | ORAL_TABLET | Freq: Three times a day (TID) | ORAL | Status: DC | PRN

## 2011-08-16 MED ORDER — PREDNISONE 20 MG PO TABS: ORAL_TABLET | ORAL | Status: DC

## 2011-08-16 MED ORDER — FEXOFENADINE-PSEUDOEPHED ER 60-120 MG PO TB12: 1.0000 | ORAL_TABLET | Freq: Two times a day (BID) | ORAL | Status: DC

## 2011-08-16 MED ORDER — AZITHROMYCIN 250 MG PO TABS: 250.0000 mg | ORAL_TABLET | Freq: Every day | ORAL | Status: AC

## 2011-08-16 NOTE — ED Provider Notes (Signed)
History     CSN: [id number]  Arrival date & time 08/16/11  1728   First MD Initiated Contact with Patient 08/16/11 1738      Chief Complaint  Patient presents with  . Headache    (Consider location/radiation/quality/duration/timing/severity/associated sxs/prior treatment) HPI Comments: 26 y/o female here c/o intermittent headache and nausea for 3 days, also states she has had nasal congestion and sinus pressure for about 1 week and just got out of a cold. Denies fever or chills, no cough or SOB, had had bilateral ear pressure. Pregnancy test here is negative.   History reviewed. No pertinent past medical history.  Past Surgical History  Procedure Date  . Cesarean section     Family History  Problem Relation Age of Onset  . Asthma Mother   . Diabetes Father   . Hypertension Father     History  Substance Use Topics  . Smoking status: Current Everyday Smoker -- 0.5 packs/day for 3 years    Types: Cigarettes  . Smokeless tobacco: Not on file  . Alcohol Use: Yes     Occasional    OB History    Grav Para Term Preterm Abortions TAB SAB Ect Mult Living                  Review of Systems  Constitutional: Negative for fever, chills and appetite change.  HENT: Positive for congestion, rhinorrhea, postnasal drip and sinus pressure. Negative for sore throat, facial swelling, trouble swallowing, neck stiffness and voice change.   Respiratory: Negative for cough, chest tightness, shortness of breath and wheezing.   Gastrointestinal: Positive for nausea. Negative for vomiting and abdominal pain.  Musculoskeletal: Negative for myalgias and arthralgias.  Skin: Negative for rash.  Neurological: Positive for headaches. Negative for dizziness, seizures, facial asymmetry, weakness and numbness.    Allergies  Review of patient's allergies indicates no known allergies.  Home Medications   Current Outpatient Rx  Name Route Sig Dispense Refill  . AZITHROMYCIN 250 MG PO TABS Oral  Take 1 tablet (250 mg total) by mouth daily. Take first 2 tablets together, then 1 every day until finished. 6 tablet 0  . FEXOFENADINE-PSEUDOEPHED ER 60-120 MG PO TB12 Oral Take 1 tablet by mouth every 12 (twelve) hours. 30 tablet 0  . IBUPROFEN 800 MG PO TABS Oral Take 1 tablet (800 mg total) by mouth every 8 (eight) hours as needed for pain. 21 tablet 0    Take with food  . NAPROXEN 500 MG PO TABS Oral Take 1 tablet (500 mg total) by mouth 2 (two) times daily. 30 tablet 0  . PREDNISONE 20 MG PO TABS  2 tabs po daily for 5 days 10 tablet no    BP 149/94  Pulse 87  Temp(Src) 98.9 F (37.2 C) (Oral)  Resp 16  SpO2 98%  LMP 08/07/2011  Physical Exam  Nursing note and vitals reviewed. Constitutional: She is oriented to person, place, and time. She appears well-developed and well-nourished. No distress.  HENT:  Head: Normocephalic and atraumatic.  Right Ear: External ear normal.  Left Ear: External ear normal.  Mouth/Throat: No oropharyngeal exudate.       Nasal voice with nasal congestion with swollen red turbinates. TMs appear with clear fluid behind bilaterally. Otherwise normal light reflex and landmarks.   Eyes: Conjunctivae and EOM are normal. Pupils are equal, round, and reactive to light. Right eye exhibits no discharge. Left eye exhibits no discharge. No scleral icterus.  Neck: Neck supple.  Cardiovascular: Normal heart sounds.   Pulmonary/Chest: Effort normal and breath sounds normal. No respiratory distress. She has no wheezes. She has no rales. She exhibits no tenderness.  Abdominal: Soft. There is no tenderness. There is no rebound and no guarding.  Lymphadenopathy:    She has no cervical adenopathy.  Neurological: She is alert and oriented to person, place, and time. She has normal reflexes. No cranial nerve deficit. She exhibits normal muscle tone. Coordination normal.  Skin: No rash noted.    ED Course  Procedures (including critical care time)   Labs Reviewed    POCT PREGNANCY, URINE  LAB REPORT - SCANNED  POCT PREGNANCY, URINE   No results found.   1. Sinusitis       MDM          Sharin Grave, MD 08/19/11 [phone]

## 2011-08-16 NOTE — ED Notes (Signed)
C/O intermittent left posterior and lateral headache x 3 days w/ some nausea.  Has taken Tylenol, but it's not helping today.  Denies hx migraines.

## 2011-08-25 ENCOUNTER — Encounter (HOSPITAL_COMMUNITY): Payer: Self-pay | Admitting: Emergency Medicine

## 2011-08-25 ENCOUNTER — Emergency Department (HOSPITAL_COMMUNITY)
Admission: EM | Admit: 2011-08-25 | Discharge: 2011-08-26 | Disposition: A | Discharge status: Home / Self Care | Payer: Medicaid Other | Attending: Emergency Medicine | Admitting: Emergency Medicine

## 2011-08-25 DIAGNOSIS — N939 Abnormal uterine and vaginal bleeding, unspecified: Secondary | ICD-10-CM

## 2011-08-25 DIAGNOSIS — R110 Nausea: Secondary | ICD-10-CM | POA: Insufficient documentation

## 2011-08-25 DIAGNOSIS — N898 Other specified noninflammatory disorders of vagina: Secondary | ICD-10-CM | POA: Insufficient documentation

## 2011-08-25 DIAGNOSIS — F17200 Nicotine dependence, unspecified, uncomplicated: Secondary | ICD-10-CM | POA: Insufficient documentation

## 2011-08-25 NOTE — ED Notes (Signed)
Pt alert, nad, c/o vaginal bleeding, onset two weeks ago, states hx irregular periods, denies trauma or injury. States flow is heavy, 2-3 pads per day

## 2011-08-26 LAB — URINE MICROSCOPIC-ADD ON

## 2011-08-26 LAB — URINALYSIS, ROUTINE W REFLEX MICROSCOPIC
Glucose, UA: NEGATIVE mg/dL
Ketones, ur: NEGATIVE mg/dL
Leukocytes, UA: NEGATIVE
Nitrite: NEGATIVE
Specific Gravity, Urine: 1.018 (ref 1.005–1.030)
pH: 6 (ref 5.0–8.0)

## 2011-08-26 LAB — POCT PREGNANCY, URINE: Preg Test, Ur: NEGATIVE

## 2011-08-26 NOTE — ED Provider Notes (Signed)
History     CSN: [id number]  Arrival date & time 08/25/11  2343   First MD Initiated Contact with Patient 08/26/11 0032      Chief Complaint  Patient presents with  . Vaginal Bleeding    (Consider location/radiation/quality/duration/timing/severity/associated sxs/prior treatment) HPI Comments: Patient with history of abnormal menstrual cycles has now had a period for 2 weeks or 2-4 pads per day with some clot.  Last normal period was 3 months ago.  She is sexually active without protection.  She did performed a home pregnancy test one week ago, which was negative .  She does have a primary care physician, who she has not contacted for this problem  Patient is a 26 y.o. female presenting with vaginal bleeding. The history is provided by the patient.  Vaginal Bleeding This is a new problem. The current episode started 1 to 4 weeks ago. The problem occurs constantly. The problem has been unchanged. Associated symptoms include nausea. Pertinent negatives include no abdominal pain, chills, fever or weakness. The treatment provided no relief.    History reviewed. No pertinent past medical history.  Past Surgical History  Procedure Date  . Cesarean section     Family History  Problem Relation Age of Onset  . Asthma Mother   . Diabetes Father   . Hypertension Father     History  Substance Use Topics  . Smoking status: Current Everyday Smoker -- 0.5 packs/day for 3 years    Types: Cigarettes  . Smokeless tobacco: Not on file  . Alcohol Use: Yes     Occasional    OB History    Grav Para Term Preterm Abortions TAB SAB Ect Mult Living                  Review of Systems  Constitutional: Negative for fever and chills.  Eyes: Negative.   Cardiovascular: Negative.   Gastrointestinal: Positive for nausea. Negative for abdominal pain.  Genitourinary: Positive for vaginal bleeding. Negative for dysuria, frequency, vaginal discharge and vaginal pain.  Neurological: Negative for  dizziness and weakness.    Allergies  Review of patient's allergies indicates no known allergies.  Home Medications  No current outpatient prescriptions on file.  BP 131/80  Pulse 82  Temp(Src) 98.4 F (36.9 C) (Oral)  Resp 16  Wt 220 lb (99.791 kg)  SpO2 100%  LMP 08/07/2011  Physical Exam  Constitutional: She is oriented to person, place, and time. She appears well-developed and well-nourished.  HENT:  Head: Normocephalic.  Eyes: Pupils are equal, round, and reactive to light.  Neck: Normal range of motion.  Cardiovascular: Normal rate.   Abdominal: Soft.  Musculoskeletal: Normal range of motion.  Neurological: She is alert and oriented to person, place, and time.  Skin: Skin is warm and dry.    ED Course  Procedures (including critical care time)   Labs Reviewed  POCT PREGNANCY, URINE  URINALYSIS, ROUTINE W REFLEX MICROSCOPIC  POCT PREGNANCY, URINE   No results found.   1. Abnormal vaginal bleeding    Will obtain a pregnancy test if, negative, we'll allow patient to followup with her primary care physician as I do not feel comfortable.  This time.  Prescribing Provera to "stop" her vaginal bleeding   MDM  Abnormal vaginal bleeding        Arman Filter, NP 08/26/11 0139

## 2011-08-26 NOTE — ED Provider Notes (Signed)
Medical screening examination/treatment/procedure(s) were performed by non-physician practitioner and as supervising physician I was immediately available for consultation/collaboration.  Jasmine Awe, MD 08/26/11 [phone]

## 2011-11-03 ENCOUNTER — Encounter (HOSPITAL_COMMUNITY): Payer: Self-pay | Admitting: *Deleted

## 2011-11-03 ENCOUNTER — Emergency Department (INDEPENDENT_AMBULATORY_CARE_PROVIDER_SITE_OTHER)
Admission: EM | Admit: 2011-11-03 | Discharge: 2011-11-03 | Disposition: A | Discharge status: Home / Self Care | Payer: Medicaid Other | Source: Home / Self Care | Attending: Emergency Medicine | Admitting: Emergency Medicine

## 2011-11-03 DIAGNOSIS — N760 Acute vaginitis: Secondary | ICD-10-CM

## 2011-11-03 HISTORY — DX: Gonococcal infection, unspecified: A54.9

## 2011-11-03 HISTORY — DX: Acute vaginitis: B96.89

## 2011-11-03 HISTORY — DX: Other specified bacterial agents as the cause of diseases classified elsewhere: B96.89

## 2011-11-03 HISTORY — DX: Chlamydial infection, unspecified: A74.9

## 2011-11-03 HISTORY — DX: Other specified bacterial agents as the cause of diseases classified elsewhere: N76.0

## 2011-11-03 LAB — POCT URINALYSIS DIP (DEVICE)
Glucose, UA: NEGATIVE mg/dL
Hgb urine dipstick: NEGATIVE
Nitrite: NEGATIVE
Protein, ur: NEGATIVE mg/dL
Specific Gravity, Urine: 1.02 (ref 1.005–1.030)
Urobilinogen, UA: 1 mg/dL (ref 0.0–1.0)
pH: 7 (ref 5.0–8.0)

## 2011-11-03 LAB — WET PREP, GENITAL: Trich, Wet Prep: NONE SEEN

## 2011-11-03 MED ORDER — FLUCONAZOLE 150 MG PO TABS: ORAL_TABLET | ORAL | Status: AC

## 2011-11-03 MED ORDER — METRONIDAZOLE 0.75 % VA GEL: VAGINAL | Status: AC

## 2011-11-03 NOTE — Discharge Instructions (Signed)
Take the medication as written. Give us a working phone number so that we can contact you if needed. Refrain from sexual contact until you know your results and your partner(s) are treated. Return if you get worse, have a fever >100.4, or for any concerns.  ° °Go to www.goodrx.com to look up your medications. This will give you a list of where you can find your prescriptions at the most affordable prices.  °

## 2011-11-03 NOTE — ED Notes (Signed)
C/O low abdominal pain only when urinating x 3 days; denies pain at present.  Also c/o urinary frequency, malodorous vaginal discharge x 3 days.  Denies fevers.  Reports irregular menstrual periods.

## 2011-11-04 LAB — GC/CHLAMYDIA PROBE AMP, GENITAL: GC Probe Amp, Genital: NEGATIVE

## 2011-11-04 NOTE — ED Notes (Addendum)
GC/Chlamydia neg., Wet prep: many clue cells, few WBC's.  Pt. adequately treated with Metrogel. Jillian Rowland 11/04/2011

## 2011-11-04 NOTE — ED Provider Notes (Signed)
History     CSN: [id number]  Arrival date & time 11/03/11  1806   First MD Initiated Contact with Patient 11/03/11 1819      Chief Complaint  Patient presents with  . Abdominal Pain  . Dysuria  . Vaginal Discharge    (Consider location/radiation/quality/duration/timing/severity/associated sxs/prior treatment) HPI Comments: Patient reports lower abdominal pressure after urinating, urine  frequency, and thi, oderous vaginal discharge for the past 3 days. No cloudy, oderous urine, hematuria. No nausea, vomiting, fevers, other abdominal pain, back pain. Patient sexually active with a female partner, who is asymptomatic. they do not use condoms. STDs not a concern today. Patient has a history of recurrent BV. Also history of gonorrhea and Chlamydia. No history of trichomonas, herpes, syphilis, HIV.  Patient is a 26 y.o. female presenting with abdominal pain, dysuria, and vaginal discharge. The history is provided by the patient. No language interpreter was used.  Abdominal Pain The primary symptoms of the illness include abdominal pain, dysuria and vaginal discharge. The primary symptoms of the illness do not include fever or vaginal bleeding. The current episode started more than 2 days ago. The onset of the illness was gradual. The problem has not changed since onset. The dysuria is associated with frequency and urgency. The dysuria is not associated with hematuria or vaginal pain.  The vaginal discharge is associated with dysuria.  The patient has not had a change in bowel habit. Additional symptoms associated with the illness include urgency and frequency. Symptoms associated with the illness do not include chills, anorexia, hematuria or back pain.  Dysuria  Associated symptoms include frequency and urgency. Pertinent negatives include no chills, no hematuria and no flank pain.  Vaginal Discharge Associated symptoms include abdominal pain.    Past Medical History  Diagnosis Date  .  Gonorrhea   . Chlamydia   . Bacterial vaginal infection     Past Surgical History  Procedure Date  . Cesarean section   . Cesarean section     Family History  Problem Relation Age of Onset  . Asthma Mother   . Diabetes Father   . Hypertension Father     History  Substance Use Topics  . Smoking status: Current Everyday Smoker -- 0.5 packs/day for 3 years    Types: Cigarettes  . Smokeless tobacco: Not on file  . Alcohol Use: Yes     Occasional    OB History    Grav Para Term Preterm Abortions TAB SAB Ect Mult Living                  Review of Systems  Constitutional: Negative for fever and chills.  Gastrointestinal: Positive for abdominal pain. Negative for anorexia.  Genitourinary: Positive for dysuria, urgency, frequency and vaginal discharge. Negative for hematuria, flank pain, vaginal bleeding and vaginal pain.  Musculoskeletal: Negative for back pain.  Skin: Negative for rash.    Allergies  Review of patient's allergies indicates no known allergies.  Home Medications   Current Outpatient Rx  Name Route Sig Dispense Refill  . FLUCONAZOLE 150 MG PO TABS  1 tab po x 1. May repeat in 72 hours if no improvement 2 tablet 0  . METRONIDAZOLE 0.75 % VA GEL  1 applicator full pv bid x 5 days 70 g 0    BP 142/95  Pulse 76  Temp(Src) 98.9 F (37.2 C) (Oral)  Resp 18  SpO2 99%  LMP 09/20/2011  Physical Exam  Nursing note and vitals reviewed. Constitutional: She  is oriented to person, place, and time. She appears well-developed and well-nourished. No distress.  HENT:  Head: Normocephalic and atraumatic.  Eyes: EOM are normal.  Neck: Normal range of motion.  Cardiovascular: Normal rate.   Pulmonary/Chest: Effort normal.  Abdominal: Soft. Bowel sounds are normal. She exhibits no distension. There is no tenderness. There is no rebound and no guarding.  Genitourinary: Uterus normal. Pelvic exam was performed with patient supine. There is no rash on the right  labia. There is no rash on the left labia. Uterus is not tender. Cervix exhibits no motion tenderness and no friability. Right adnexum displays no mass, no tenderness and no fullness. Left adnexum displays no mass, no tenderness and no fullness. No erythema, tenderness or bleeding around the vagina. No foreign body around the vagina. Vaginal discharge found.       Thin white oderous  vaginal d/c.Chaperone present during exam  Musculoskeletal: Normal range of motion.  Neurological: She is alert and oriented to person, place, and time.  Skin: Skin is warm and dry.  Psychiatric: She has a normal mood and affect. Her behavior is normal. Judgment and thought content normal.    ED Course  Procedures (including critical care time)  Labs Reviewed  WET PREP, GENITAL - Abnormal; Notable for the following:    Clue Cells Wet Prep HPF POC MANY (*)    WBC, Wet Prep HPF POC FEW (*)    All other components within normal limits  POCT URINALYSIS DIP (DEVICE)  POCT PREGNANCY, URINE  GC/CHLAMYDIA PROBE AMP, GENITAL   No results found.   1. Vaginitis     Results for orders placed during the hospital encounter of 11/03/11  POCT URINALYSIS DIP (DEVICE)      Component Value Range   Glucose, UA NEGATIVE  NEGATIVE (mg/dL)   Bilirubin Urine NEGATIVE  NEGATIVE    Ketones, ur NEGATIVE  NEGATIVE (mg/dL)   Specific Gravity, Urine 1.020  1.005 - 1.030    Hgb urine dipstick NEGATIVE  NEGATIVE    pH 7.0  5.0 - 8.0    Protein, ur NEGATIVE  NEGATIVE (mg/dL)   Urobilinogen, UA 1.0  0.0 - 1.0 (mg/dL)   Nitrite NEGATIVE  NEGATIVE    Leukocytes, UA NEGATIVE  NEGATIVE   POCT PREGNANCY, URINE      Component Value Range   Preg Test, Ur NEGATIVE  NEGATIVE   GC/CHLAMYDIA PROBE AMP, GENITAL      Component Value Range   GC Probe Amp, Genital NEGATIVE  NEGATIVE    Chlamydia, DNA Probe NEGATIVE  NEGATIVE   WET PREP, GENITAL      Component Value Range   Yeast Wet Prep HPF POC NONE SEEN  NONE SEEN    Trich, Wet Prep  NONE SEEN  NONE SEEN    Clue Cells Wet Prep HPF POC MANY (*) NONE SEEN    WBC, Wet Prep HPF POC FEW (*) NONE SEEN      MDM  H&P most c/w yeast infection BV. Sent off GC/chlamydia, wet prep. Will not treat empirically now.  Will send home with flagyl. Patient requests diflucan for subsequent yeast infection- states she gets these frequently after taking antibiotics. Advised pt to refrain from sexual contact until she knows lab results, symptoms resolve, and partner(s) are treated. Pt provided working phone number. Pt agrees.     Luiz Blare, MD 11/04/11 1022

## 2011-11-22 IMAGING — CR DG ANKLE COMPLETE 3+V*R*
3 series · 3 of 3 positions shown · non-contrast
Comparison: None.

CLINICAL DATA: Pain after twisting injury

RIGHT ANKLE - COMPLETE 3+ VIEW

[x ankle ap right]
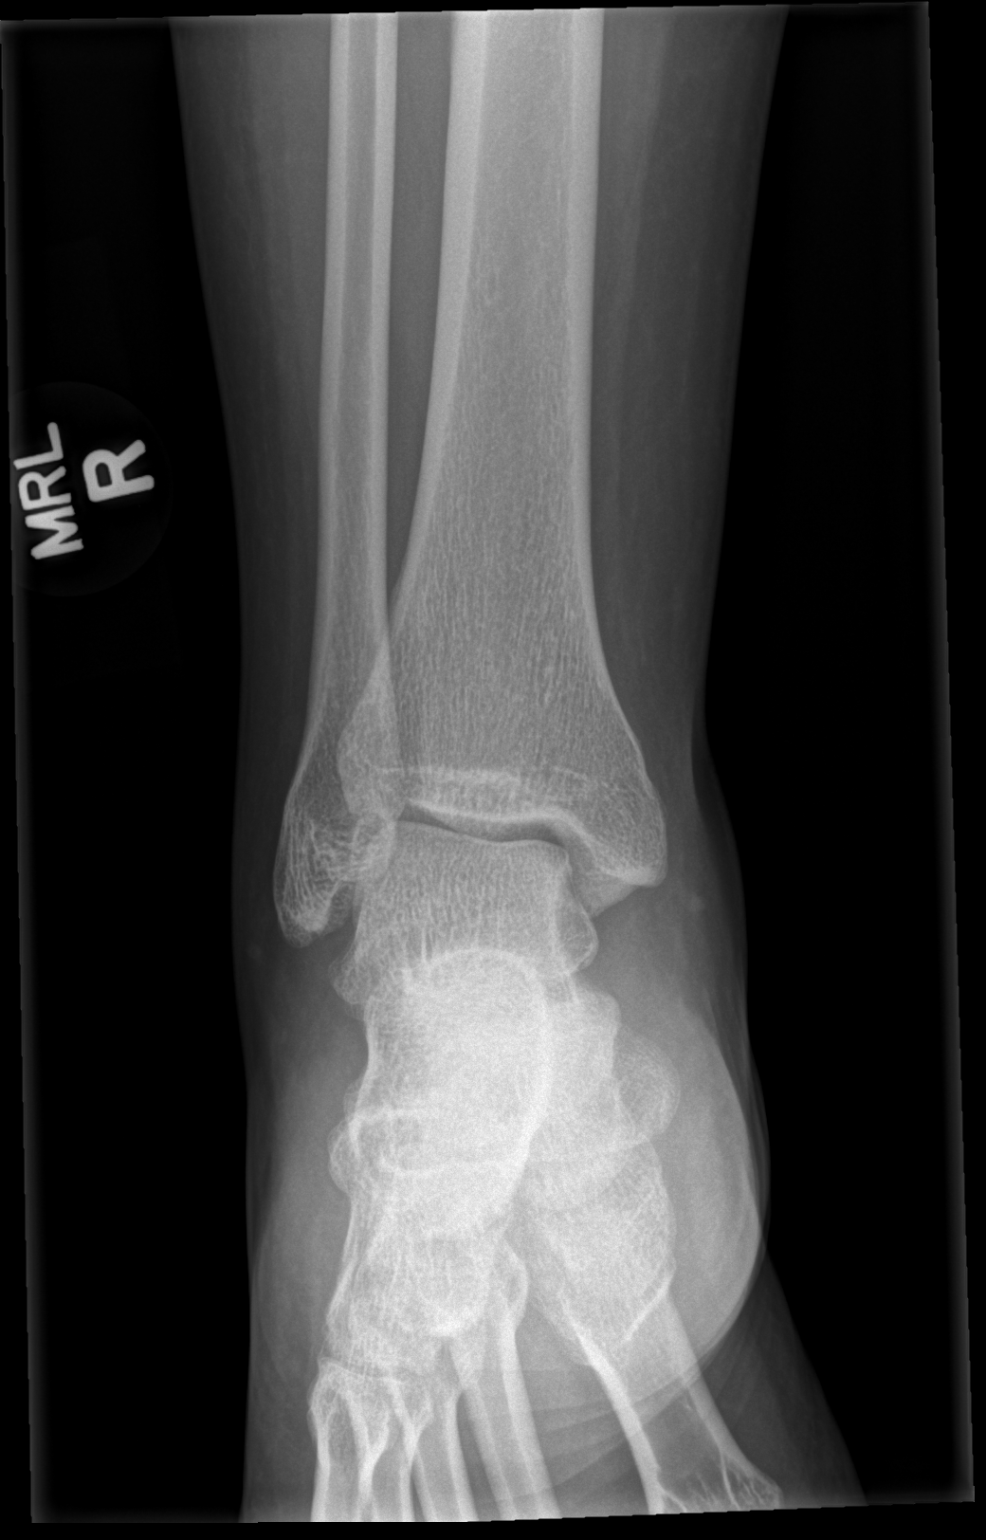

[x ankle obl right]
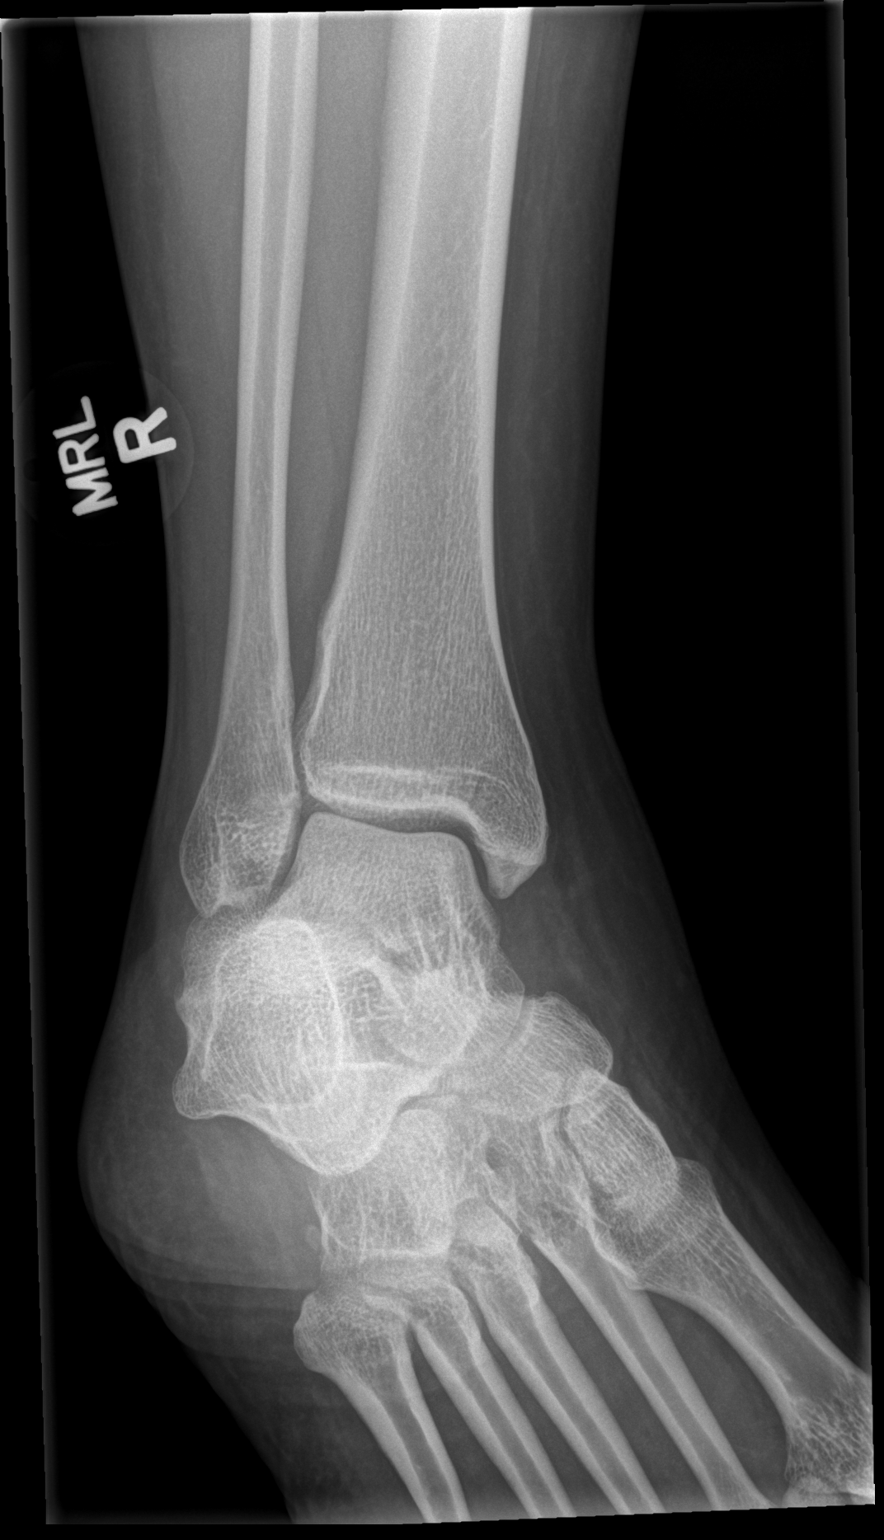

[x ankle lat right]
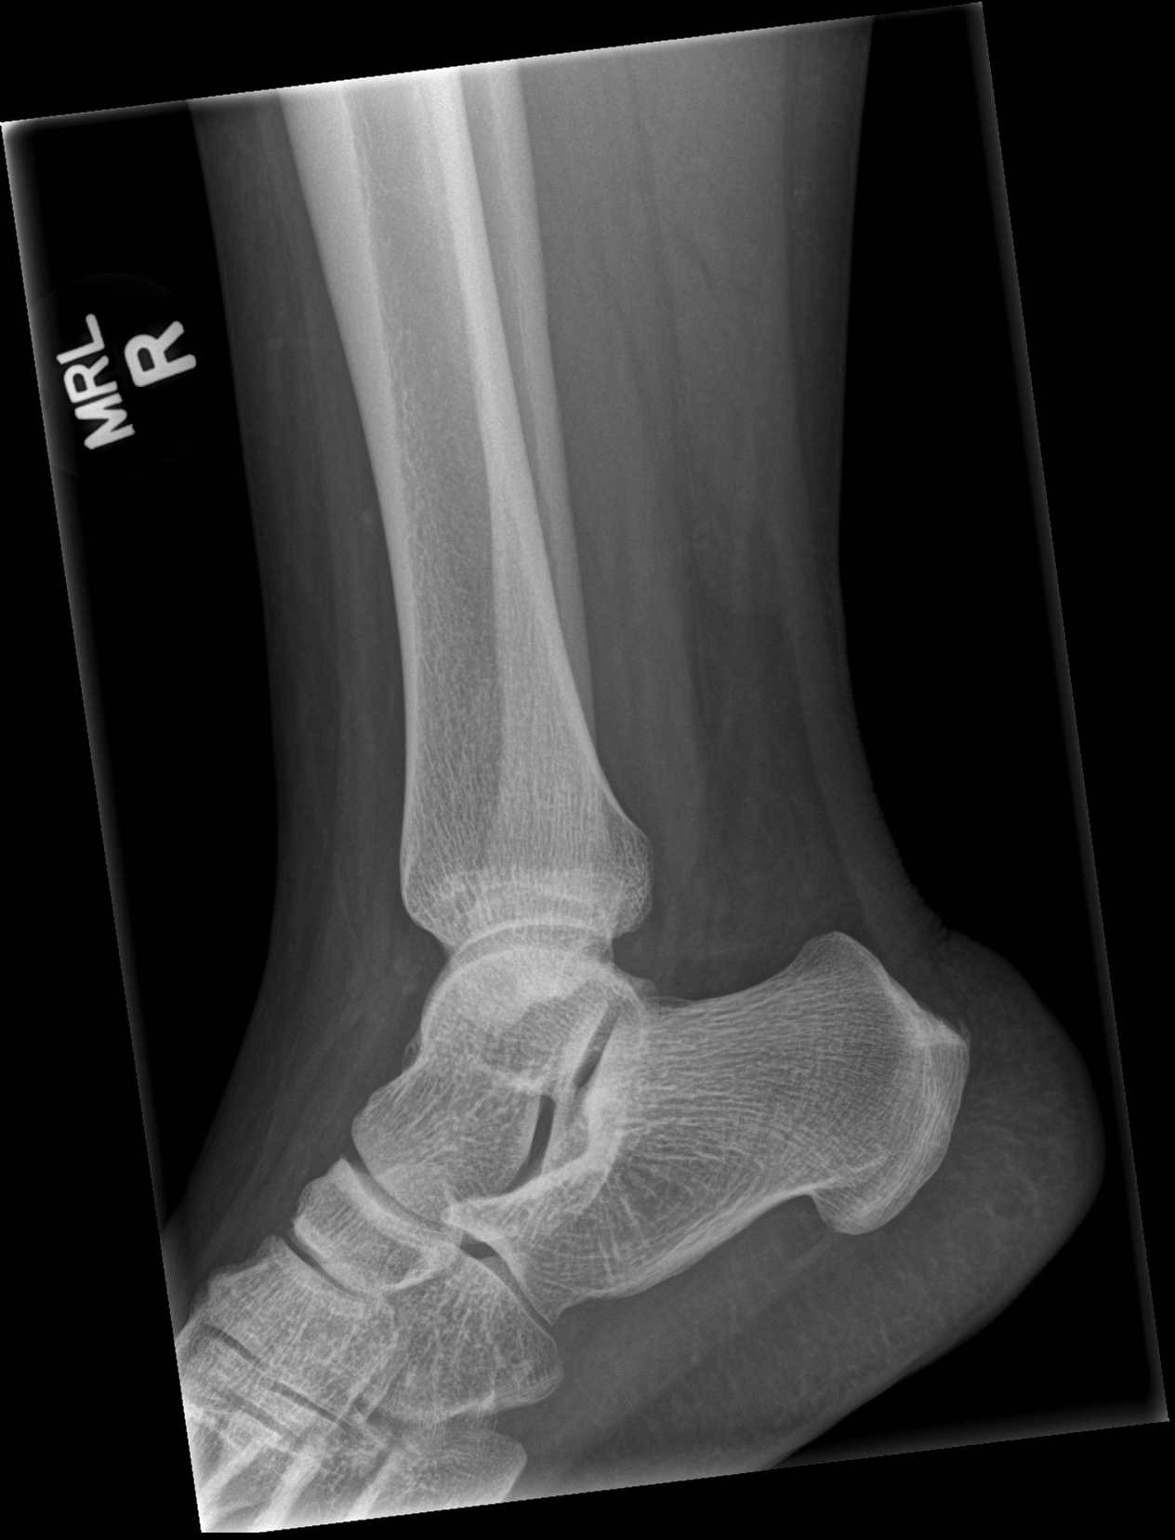

[3 of 3 positions shown; findings below may reference images not displayed]

FINDINGS: Ankle mortise intact. Negative for fracture, dislocation,
or other acute abnormality.  Normal alignment and mineralization.
No significant degenerative change.  Regional soft tissues
unremarkable.
IMPRESSION: Negative

## 2012-02-23 ENCOUNTER — Emergency Department (HOSPITAL_COMMUNITY)
Admission: EM | Admit: 2012-02-23 | Discharge: 2012-02-23 | Disposition: A | Discharge status: Home / Self Care | Payer: Medicaid Other | Source: Home / Self Care | Attending: Emergency Medicine | Admitting: Emergency Medicine

## 2012-02-23 ENCOUNTER — Encounter (HOSPITAL_COMMUNITY): Payer: Self-pay | Admitting: Emergency Medicine

## 2012-02-23 DIAGNOSIS — N760 Acute vaginitis: Secondary | ICD-10-CM

## 2012-02-23 HISTORY — DX: Candidiasis of vulva and vagina: B37.3

## 2012-02-23 HISTORY — DX: Acute candidiasis of vulva and vagina: B37.31

## 2012-02-23 LAB — POCT URINALYSIS DIP (DEVICE)
Bilirubin Urine: NEGATIVE
Ketones, ur: NEGATIVE mg/dL
Leukocytes, UA: NEGATIVE
Nitrite: NEGATIVE
pH: 6.5 (ref 5.0–8.0)

## 2012-02-23 LAB — WET PREP, GENITAL
Trich, Wet Prep: NONE SEEN
WBC, Wet Prep HPF POC: NONE SEEN
Yeast Wet Prep HPF POC: NONE SEEN

## 2012-02-23 LAB — POCT PREGNANCY, URINE: Preg Test, Ur: NEGATIVE

## 2012-02-23 MED ORDER — METRONIDAZOLE 0.75 % VA GEL: VAGINAL | Status: AC

## 2012-02-23 MED ORDER — FLUCONAZOLE 150 MG PO TABS: ORAL_TABLET | ORAL | Status: AC

## 2012-02-23 NOTE — ED Provider Notes (Signed)
History     CSN: [id number]  Arrival date & time 02/23/12  1547   First MD Initiated Contact with Patient 02/23/12 1600      Chief Complaint  Patient presents with  . Vaginal Discharge    (Consider location/radiation/quality/duration/timing/severity/associated sxs/prior treatment) HPI Comments: Pt with 5 days oderous vaginal discharge, vaginal itching. No urgency, frequency, dysuria, oderous urine, hematuria,  genital blisters. No fevers, N/V, abd pain, back pain. No recent abx use. Pt sexually active with same female partner who is  asxatic. STD's not a concern today. Similar sx before when had BV or yeast infections. H/o chlamydia. No h/o gonorrhea  trichmonoas yeast infection. No h/o syphilis, herpes, HIV.    Patient is a 26 y.o. female presenting with vaginal discharge. The history is provided by the patient. No language interpreter was used.  Vaginal Discharge This is a recurrent problem. The current episode started more than 2 days ago. The problem occurs constantly. The problem has been gradually worsening. Pertinent negatives include no chest pain and no abdominal pain. Nothing aggravates the symptoms. Nothing relieves the symptoms. She has tried nothing for the symptoms. The treatment provided no relief.    Past Medical History  Diagnosis Date  . Gonorrhea   . Chlamydia   . Bacterial vaginal infection     Past Surgical History  Procedure Date  . Cesarean section   . Cesarean section     Family History  Problem Relation Age of Onset  . Asthma Mother   . Diabetes Father   . Hypertension Father     History  Substance Use Topics  . Smoking status: Current Everyday Smoker -- 0.5 packs/day for 3 years    Types: Cigarettes  . Smokeless tobacco: Not on file  . Alcohol Use: Yes     Occasional    OB History    Grav Para Term Preterm Abortions TAB SAB Ect Mult Living                  Review of Systems  Constitutional: Negative for fever.  Eyes: Negative for  redness.  Cardiovascular: Negative for chest pain.  Gastrointestinal: Negative for abdominal pain.  Genitourinary: Positive for vaginal discharge. Negative for dysuria, urgency, frequency, hematuria, flank pain, vaginal bleeding and vaginal pain.  Skin: Negative for rash.    Allergies  Review of patient's allergies indicates no known allergies.  Home Medications  No current outpatient prescriptions on file.  BP 137/85  Pulse 80  Temp 98.5 F (36.9 C) (Oral)  Resp 16  SpO2 100%  LMP 11/22/2011  Physical Exam  Nursing note and vitals reviewed. Constitutional: She is oriented to person, place, and time. She appears well-developed and well-nourished. No distress.  HENT:  Head: Normocephalic and atraumatic.  Eyes: EOM are normal.  Neck: Normal range of motion.  Cardiovascular: Normal rate.   Pulmonary/Chest: Effort normal.  Abdominal: Soft. Bowel sounds are normal. She exhibits no distension. There is no tenderness. There is no rebound, no guarding and no CVA tenderness.  Genitourinary: Uterus normal. Pelvic exam was performed with patient supine. There is no rash on the right labia. There is no rash on the left labia. Uterus is not tender. Cervix exhibits no motion tenderness and no friability. Right adnexum displays no mass, no tenderness and no fullness. Left adnexum displays no mass, no tenderness and no fullness. No erythema, tenderness or bleeding around the vagina. No foreign body around the vagina. Vaginal discharge found.  thick yellowish nonoderous vaginal d/c.Chaperone present during exam  Musculoskeletal: Normal range of motion.  Neurological: She is alert and oriented to person, place, and time.  Skin: Skin is warm and dry.  Psychiatric: She has a normal mood and affect. Her behavior is normal. Judgment and thought content normal.    ED Course  Procedures (including critical care time)   Labs Reviewed  POCT URINALYSIS DIP (DEVICE)  POCT PREGNANCY, URINE    No results found.   No diagnosis found. Results for orders placed during the hospital encounter of 02/23/12  POCT URINALYSIS DIP (DEVICE)      Component Value Range   Glucose, UA NEGATIVE  NEGATIVE mg/dL   Bilirubin Urine NEGATIVE  NEGATIVE   Ketones, ur NEGATIVE  NEGATIVE mg/dL   Specific Gravity, Urine 1.020  1.005 - 1.030   Hgb urine dipstick NEGATIVE  NEGATIVE   pH 6.5  5.0 - 8.0   Protein, ur NEGATIVE  NEGATIVE mg/dL   Urobilinogen, UA 1.0  0.0 - 1.0 mg/dL   Nitrite NEGATIVE  NEGATIVE   Leukocytes, UA NEGATIVE  NEGATIVE  POCT PREGNANCY, URINE      Component Value Range   Preg Test, Ur NEGATIVE  NEGATIVE     MDM  Previous records, labs reviewed. History vaginal yeast infections, recurrent BV. Chlamydia positive 2x in 2010. She since tested negative multiple times for Chlamydia. She has tested negative for Trichomonas, gonorrhea, HIV, syphilis multiple times since 2008,.  H&P most c/w BV . Sent off GC/chlamydia, wet prep. Will not treat empirically now. Will send home with flagyl PV per pt request. diflucan for possible yeast infection. Advised pt to refrain from sexual contact until she knows lab results, symptoms resolve, and partner(s) are treated. Pt provided working phone number. Pt agrees.     Luiz Blare, MD 02/23/12 [phone]

## 2012-02-23 NOTE — ED Notes (Signed)
PT HERE WITH VAGINAL IRRITATION,SEVERE ITCHING WITH CLEAR VAG D/C,ODOR THAT STARTED X 5 DYS AGO.DENIES ABD OR BACK PAIN.HX IRREG MENSES LMP 11/22/11

## 2012-02-24 LAB — GC/CHLAMYDIA PROBE AMP, GENITAL: GC Probe Amp, Genital: NEGATIVE

## 2012-02-24 NOTE — ED Notes (Addendum)
GC/Chlamydia neg.,  Wet prep: Many clue cells.  Pt. adequately treated with Metrogel. Vassie Moselle 02/24/2012

## 2012-05-22 ENCOUNTER — Emergency Department (INDEPENDENT_AMBULATORY_CARE_PROVIDER_SITE_OTHER)
Admission: EM | Admit: 2012-05-22 | Discharge: 2012-05-22 | Disposition: A | Discharge status: Home / Self Care | Payer: Self-pay | Source: Home / Self Care

## 2012-05-22 ENCOUNTER — Encounter (HOSPITAL_COMMUNITY): Payer: Self-pay | Admitting: *Deleted

## 2012-05-22 DIAGNOSIS — J029 Acute pharyngitis, unspecified: Secondary | ICD-10-CM

## 2012-05-22 MED ORDER — GUAIFENESIN-CODEINE 100-10 MG/5ML PO SYRP: 5.0000 mL | ORAL_SOLUTION | Freq: Four times a day (QID) | ORAL | Status: DC | PRN

## 2012-05-22 MED ORDER — AMOXICILLIN 500 MG PO CAPS: 500.0000 mg | ORAL_CAPSULE | Freq: Three times a day (TID) | ORAL | Status: DC

## 2012-05-22 NOTE — ED Notes (Signed)
Pt  Reports  Symptoms  Of  sorethroat  As  Well  As   Sensation of  An  Earache   With       Fluid  In the  Affected   Ear  -  She  At this  Time  Is  Sitting  Upright on  Exam table  Speaking in  Complete  sentances   In  No  Acute  Distress

## 2012-05-22 NOTE — ED Provider Notes (Signed)
Medical screening examination/treatment/procedure(s) were performed by resident physician or non-physician practitioner and as supervising physician I was immediately available for consultation/collaboration.   Barkley Bruns MD.    Linna Hoff, MD 05/22/12 [phone]

## 2012-05-22 NOTE — ED Provider Notes (Signed)
History     CSN: [id number]  Arrival date & time 05/22/12  1004   None     Chief Complaint  Patient presents with  . Sore Throat    (Consider location/radiation/quality/duration/timing/severity/associated sxs/prior treatment) HPI Comments: 26 year old female presents with sore throat and sensation of her ears being clogged. She also is nasal stuffiness and a headache.   Past Medical History  Diagnosis Date  . Gonorrhea   . Chlamydia   . Bacterial vaginal infection   . Yeast vaginitis     Past Surgical History  Procedure Date  . Cesarean section   . Cesarean section     Family History  Problem Relation Age of Onset  . Asthma Mother   . Diabetes Father   . Hypertension Father     History  Substance Use Topics  . Smoking status: Current Every Day Smoker -- 0.5 packs/day for 3 years    Types: Cigarettes  . Smokeless tobacco: Not on file  . Alcohol Use: Yes     Occasional    OB History    Grav Para Term Preterm Abortions TAB SAB Ect Mult Living                  Review of Systems  Constitutional: Negative for fever, chills, activity change, appetite change and fatigue.  HENT: Positive for congestion, rhinorrhea and postnasal drip. Negative for facial swelling, neck pain and neck stiffness.   Eyes: Negative.   Respiratory: Negative.   Cardiovascular: Negative.   Skin: Negative for pallor and rash.  Neurological: Negative.     Allergies  Review of patient's allergies indicates no known allergies.  Home Medications   Current Outpatient Rx  Name Route Sig Dispense Refill  . AMOXICILLIN 500 MG PO CAPS Oral Take 1 capsule (500 mg total) by mouth 3 (three) times daily. 21 capsule 0  . GUAIFENESIN-CODEINE 100-10 MG/5ML PO SYRP Oral Take 5 mLs by mouth 4 (four) times daily as needed for cough. 120 mL 0    BP 130/77  Pulse 73  Temp 98.5 F (36.9 C) (Oral)  Resp 16  SpO2 100%  LMP 05/15/2012  Physical Exam  Constitutional: She is oriented to person,  place, and time. She appears well-developed and well-nourished. No distress.  HENT:  Right Ear: External ear normal.  Left Ear: External ear normal.  Nose: Nose normal.  Mouth/Throat: No oropharyngeal exudate.       Oropharynx is mildly erythematous with clear PND.  Eyes: Conjunctivae normal and EOM are normal. No scleral icterus.  Neck: Normal range of motion. Neck supple.  Cardiovascular: Normal rate and regular rhythm.   Pulmonary/Chest: Effort normal and breath sounds normal. No respiratory distress. She has no wheezes.  Abdominal: Soft. There is no tenderness.  Musculoskeletal: Normal range of motion. She exhibits no edema and no tenderness.  Lymphadenopathy:    She has no cervical adenopathy.  Neurological: She is alert and oriented to person, place, and time. No cranial nerve deficit.  Skin: Skin is warm and dry. No rash noted.  Psychiatric: She has a normal mood and affect.    ED Course  Procedures (including critical care time)   Labs Reviewed  POCT RAPID STREP A ([facility] ONLY)   No results found.   1. Pharyngitis       MDM  Amoxicillin 1 g twice a day for 7 days. Cheratussin which teaspoons every 4 hours when necessary cough and drainage May take NyQuil at nighttime for sleep.  May take DayQuil during the day. To be out of work for 2 days.  Results for orders placed during the hospital encounter of 05/22/12  POCT RAPID STREP A ([facility] ONLY)      Component Value Range   Streptococcus, Group A Screen (Direct) NEGATIVE  NEGATIVE         Hayden Rasmussen, NP 05/22/12 1708

## 2012-11-18 ENCOUNTER — Other Ambulatory Visit (HOSPITAL_COMMUNITY)
Admission: RE | Admit: 2012-11-18 | Discharge: 2012-11-18 | Disposition: A | Discharge status: Home / Self Care | Payer: Medicaid Other | Source: Ambulatory Visit | Attending: Family Medicine | Admitting: Family Medicine

## 2012-11-18 ENCOUNTER — Encounter (HOSPITAL_COMMUNITY): Payer: Self-pay | Admitting: Emergency Medicine

## 2012-11-18 ENCOUNTER — Emergency Department (HOSPITAL_COMMUNITY)
Admission: EM | Admit: 2012-11-18 | Discharge: 2012-11-18 | Disposition: A | Discharge status: Home / Self Care | Payer: Medicaid Other | Source: Home / Self Care

## 2012-11-18 DIAGNOSIS — Z113 Encounter for screening for infections with a predominantly sexual mode of transmission: Secondary | ICD-10-CM | POA: Insufficient documentation

## 2012-11-18 DIAGNOSIS — N760 Acute vaginitis: Secondary | ICD-10-CM | POA: Insufficient documentation

## 2012-11-18 LAB — POCT URINALYSIS DIP (DEVICE)
Glucose, UA: NEGATIVE mg/dL
Nitrite: NEGATIVE
Protein, ur: NEGATIVE mg/dL
Specific Gravity, Urine: 1.02 (ref 1.005–1.030)
Urobilinogen, UA: 1 mg/dL (ref 0.0–1.0)
pH: 6 (ref 5.0–8.0)

## 2012-11-18 MED ORDER — DOXYCYCLINE HYCLATE 100 MG PO TABS: 100.0000 mg | ORAL_TABLET | Freq: Two times a day (BID) | ORAL | Status: DC

## 2012-11-18 MED ORDER — FLUCONAZOLE 150 MG PO TABS: 150.0000 mg | ORAL_TABLET | Freq: Once | ORAL | Status: DC

## 2012-11-18 MED ORDER — METRONIDAZOLE 0.75 % VA GEL: 1.0000 | Freq: Two times a day (BID) | VAGINAL | Status: DC

## 2012-11-18 MED ORDER — METRONIDAZOLE 500 MG PO TABS: 500.0000 mg | ORAL_TABLET | Freq: Two times a day (BID) | ORAL | Status: DC

## 2012-11-18 NOTE — ED Provider Notes (Signed)
Jillian Rowland is a 27 y.o. female who presents to Urgent Care today for vaginitis and impetigo. Patient notes 5 days of vaginal itching associated with a cheesy discharge and odor. She says this is typical of past yeast and pectoral vaginosis. She cannot recall her last menstrual period but has irregular cycles.  She denies any abdominal pain fevers or chill. Additionally she notes small "boils"on her thigh arms and axilla. She wonders what these are what can be done about them. This is been present for several days.   PMH reviewed. Obesity History  Substance Use Topics  . Smoking status: Current Every Day Smoker -- 0.50 packs/day for 3 years    Types: Cigarettes  . Smokeless tobacco: Not on file  . Alcohol Use: Yes     Comment: Occasional   ROS as above Medications reviewed. No current facility-administered medications for this encounter.   Current Outpatient Prescriptions  Medication Sig Dispense Refill  . doxycycline (VIBRA-TABS) 100 MG tablet Take 1 tablet (100 mg total) by mouth 2 (two) times daily.  14 tablet  0  . fluconazole (DIFLUCAN) 150 MG tablet Take 1 tablet (150 mg total) by mouth once.  1 tablet  1  . metroNIDAZOLE (METROGEL) 0.75 % vaginal gel Place 1 Applicatorful vaginally 2 (two) times daily.  70 g  0    Exam:  BP 136/79  Pulse 84  Temp(Src) 98.3 F (36.8 C) (Oral)  Resp 16  SpO2 100% Gen: Well NAD HEENT: EOMI,  MMM Lungs: CTABL Nl WOB Heart: RRR no MRG Abd: NABS, NT, ND Exts: Non edematous BL  LE, warm and well perfused.  Skin: Scattered impetigo GYN: Normal external genitalia. Vaginal canal with cheesy white discharge. Unable to visualize the cervix due to body habitus.  Bimanual exam reveals a nontender cervix with normal adnexa.   Results for orders placed during the hospital encounter of 11/18/12 (from the past 24 hour(s))  POCT URINALYSIS DIP (DEVICE)     Status: Abnormal   Collection Time    11/18/12  4:13 PM      Result Value Range   Glucose, UA NEGATIVE  NEGATIVE mg/dL   Bilirubin Urine NEGATIVE  NEGATIVE   Ketones, ur NEGATIVE  NEGATIVE mg/dL   Specific Gravity, Urine 1.020  1.005 - 1.030   Hgb urine dipstick TRACE (*) NEGATIVE   pH 6.0  5.0 - 8.0   Protein, ur NEGATIVE  NEGATIVE mg/dL   Urobilinogen, UA 1.0  0.0 - 1.0 mg/dL   Nitrite NEGATIVE  NEGATIVE   Leukocytes, UA LARGE (*) NEGATIVE  POCT PREGNANCY, URINE     Status: None   Collection Time    11/18/12  4:20 PM      Result Value Range   Preg Test, Ur NEGATIVE  NEGATIVE   No results found.  Assessment and Plan: 27 y.o. female with  1) vaginitis: Likely yeast versus BV. Empiric treatment with vaginal metronidazole and oral fluconazole. Testing for gonorrhea, Chlamydia, trichomonas, BV, yeast.  2) impetigo: Doxycycline twice daily for one week.  Discussed warning signs or symptoms. Please see discharge instructions. Patient expresses understanding.       Rodolph Bong, MD 11/18/12 [phone]

## 2012-11-18 NOTE — ED Notes (Signed)
Pt reports severe vaginal itching x 5 days. Denies discharge  Pt has concerns for exposure to std's  Pt also states recent change in body lotions and soaps

## 2012-11-18 NOTE — ED Provider Notes (Signed)
Medical screening examination/treatment/procedure(s) were performed by non-physician practitioner and as supervising physician I was immediately available for consultation/collaboration.  Raynald Blend, MD 11/18/12 1718

## 2012-11-20 NOTE — ED Notes (Signed)
GC/Chlamydia neg., Affirm:Gardnerella and Candida pos., Trich neg. Pt. adequately treated with Metrogel 0.75% and Diflucan. Vassie Moselle 11/20/2012

## 2012-11-26 ENCOUNTER — Telehealth (HOSPITAL_COMMUNITY): Payer: Self-pay | Admitting: *Deleted

## 2012-11-26 NOTE — ED Notes (Signed)
Pt. called on VM for her lab results.  I called pt. back.  Pt. verified x 2 and given results.  Pt. told she was adequately treated for the Candida with the Diflucan and the bacterial vaginosis with the Metrogel. Vassie Moselle 11/26/2012

## 2012-12-24 ENCOUNTER — Emergency Department (INDEPENDENT_AMBULATORY_CARE_PROVIDER_SITE_OTHER)
Admission: EM | Admit: 2012-12-24 | Discharge: 2012-12-24 | Disposition: A | Discharge status: Home / Self Care | Payer: Medicaid Other | Source: Home / Self Care | Attending: Family Medicine | Admitting: Family Medicine

## 2012-12-24 ENCOUNTER — Encounter (HOSPITAL_COMMUNITY): Payer: Self-pay | Admitting: Emergency Medicine

## 2012-12-24 DIAGNOSIS — J309 Allergic rhinitis, unspecified: Secondary | ICD-10-CM

## 2012-12-24 DIAGNOSIS — J302 Other seasonal allergic rhinitis: Secondary | ICD-10-CM

## 2012-12-24 MED ORDER — FEXOFENADINE HCL 180 MG PO TABS: 180.0000 mg | ORAL_TABLET | Freq: Every day | ORAL | Status: DC

## 2012-12-24 MED ORDER — AZITHROMYCIN 250 MG PO TABS: ORAL_TABLET | ORAL | Status: DC

## 2012-12-24 MED ORDER — FLUTICASONE PROPIONATE 50 MCG/ACT NA SUSP: 1.0000 | Freq: Two times a day (BID) | NASAL | Status: DC

## 2012-12-24 MED ORDER — METHYLPREDNISOLONE ACETATE 80 MG/ML IJ SUSP
INTRAMUSCULAR | Status: AC
  Filled 2012-12-24: qty 1

## 2012-12-24 MED ORDER — METHYLPREDNISOLONE ACETATE 40 MG/ML IJ SUSP
80.0000 mg | Freq: Once | INTRAMUSCULAR | Status: AC
  Administered 2012-12-24: 80 mg via INTRAMUSCULAR

## 2012-12-24 NOTE — ED Notes (Signed)
Ear, head and sore throat discomfort, onset 4 days ago. Has used over the counter medicines and throat spray.

## 2012-12-24 NOTE — ED Provider Notes (Signed)
History     CSN: [id number]  Arrival date & time 12/24/12  4098   First MD Initiated Contact with Patient 12/24/12 1915      No chief complaint on file.   (Consider location/radiation/quality/duration/timing/severity/associated sxs/prior treatment) Patient is a 27 y.o. female presenting with URI. The history is provided by the patient.  URI Presenting symptoms: congestion, cough, ear pain, rhinorrhea and sore throat   Presenting symptoms: no fever   Severity:  Mild Onset quality:  Gradual Duration:  4 days Timing:  Constant Progression:  Unchanged Chronicity:  New Associated symptoms: no sneezing and no wheezing     Past Medical History  Diagnosis Date  . Gonorrhea   . Chlamydia   . Bacterial vaginal infection   . Yeast vaginitis     Past Surgical History  Procedure Laterality Date  . Cesarean section    . Cesarean section      Family History  Problem Relation Age of Onset  . Asthma Mother   . Diabetes Father   . Hypertension Father     History  Substance Use Topics  . Smoking status: Current Every Day Smoker -- 0.50 packs/day for 3 years    Types: Cigarettes  . Smokeless tobacco: Not on file  . Alcohol Use: Yes     Comment: Occasional    OB History   Grav Para Term Preterm Abortions TAB SAB Ect Mult Living                  Review of Systems  Constitutional: Negative.  Negative for fever.  HENT: Positive for ear pain, congestion, sore throat and rhinorrhea. Negative for sneezing.   Respiratory: Positive for cough. Negative for wheezing.   Gastrointestinal: Negative.     Allergies  Review of patient's allergies indicates no known allergies.  Home Medications   Current Outpatient Rx  Name  Route  Sig  Dispense  Refill  . azithromycin (ZITHROMAX Z-PAK) 250 MG tablet      Take as directed on pack   6 each   0   . doxycycline (VIBRA-TABS) 100 MG tablet   Oral   Take 1 tablet (100 mg total) by mouth 2 (two) times daily.   14 tablet   0   . fexofenadine (ALLEGRA) 180 MG tablet   Oral   Take 1 tablet (180 mg total) by mouth daily.   30 tablet   1   . fluconazole (DIFLUCAN) 150 MG tablet   Oral   Take 1 tablet (150 mg total) by mouth once.   1 tablet   1   . fluticasone (FLONASE) 50 MCG/ACT nasal spray   Nasal   Place 1 spray into the nose 2 (two) times daily.   1 g   2   . metroNIDAZOLE (METROGEL) 0.75 % vaginal gel   Vaginal   Place 1 Applicatorful vaginally 2 (two) times daily.   70 g   0     BP 155/90  Pulse 77  Temp(Src) 99.4 F (37.4 C) (Oral)  Resp 18  SpO2 97%  Physical Exam  Nursing note and vitals reviewed. Constitutional: She is oriented to person, place, and time. She appears well-developed and well-nourished.  HENT:  Head: Normocephalic.  Right Ear: External ear normal.  Left Ear: External ear normal.  Nose: Mucosal edema and rhinorrhea present.  Mouth/Throat: Oropharynx is clear and moist.  Neck: Normal range of motion. Neck supple.  Cardiovascular: Normal rate, normal heart sounds and intact distal pulses.  Pulmonary/Chest: Breath sounds normal.  Lymphadenopathy:    She has no cervical adenopathy.  Neurological: She is alert and oriented to person, place, and time.  Skin: Skin is warm and dry.    ED Course  Procedures (including critical care time)  Labs Reviewed - No data to display No results found.   1. Seasonal allergic rhinitis       MDM          Linna Hoff, MD 12/24/12 [phone]

## 2012-12-25 ENCOUNTER — Encounter (HOSPITAL_COMMUNITY): Payer: Self-pay | Admitting: Emergency Medicine

## 2012-12-25 ENCOUNTER — Emergency Department (HOSPITAL_COMMUNITY)
Admission: EM | Admit: 2012-12-25 | Discharge: 2012-12-25 | Disposition: A | Discharge status: Home / Self Care | Payer: Medicaid Other | Attending: Emergency Medicine | Admitting: Emergency Medicine

## 2012-12-25 DIAGNOSIS — F17200 Nicotine dependence, unspecified, uncomplicated: Secondary | ICD-10-CM | POA: Insufficient documentation

## 2012-12-25 DIAGNOSIS — R51 Headache: Secondary | ICD-10-CM | POA: Insufficient documentation

## 2012-12-25 DIAGNOSIS — Z8619 Personal history of other infectious and parasitic diseases: Secondary | ICD-10-CM | POA: Insufficient documentation

## 2012-12-25 DIAGNOSIS — J029 Acute pharyngitis, unspecified: Secondary | ICD-10-CM | POA: Insufficient documentation

## 2012-12-25 MED ORDER — HYDROCODONE-ACETAMINOPHEN 7.5-325 MG/15ML PO SOLN: 15.0000 mL | ORAL | Status: DC | PRN

## 2012-12-25 MED ORDER — IBUPROFEN 800 MG PO TABS: 800.0000 mg | ORAL_TABLET | Freq: Three times a day (TID) | ORAL | Status: DC

## 2012-12-25 NOTE — ED Notes (Signed)
Pt reports sore throat x 4 days and was seen yesterday at Urgent care and told "i have allergies and was given a shot" and Pt reports "I don't feel better." Pt denies fever or n/v/d.

## 2012-12-25 NOTE — ED Provider Notes (Signed)
History     CSN: [id number]  Arrival date & time 12/25/12  1148   First MD Initiated Contact with Patient 12/25/12 1159      No chief complaint on file.   (Consider location/radiation/quality/duration/timing/severity/associated sxs/prior treatment) HPI Jillian Rowland is a 27 y.o. female who presents to ED with complaint of sore throat. States symptoms began 5 days ago. States was seen at [facility] 2 days ago, diagnosed with "allergies." was given a steroid shot, and started on allegra and zithromax. Pt is on day two of her antibiotic. PT states pain continues and worsening. Denies difficulty swallowing. No fever, chills. No cough. No congestion. Taking ibuprofen for pain with no relief.    Past Medical History  Diagnosis Date  . Gonorrhea   . Chlamydia   . Bacterial vaginal infection   . Yeast vaginitis     Past Surgical History  Procedure Laterality Date  . Cesarean section    . Cesarean section      Family History  Problem Relation Age of Onset  . Asthma Mother   . Diabetes Father   . Hypertension Father     History  Substance Use Topics  . Smoking status: Current Every Day Smoker -- 0.50 packs/day for 3 years    Types: Cigarettes  . Smokeless tobacco: Not on file  . Alcohol Use: Yes     Comment: Occasional    OB History   Grav Para Term Preterm Abortions TAB SAB Ect Mult Living                  Review of Systems  Constitutional: Negative for fever and chills.  HENT: Positive for sore throat. Negative for ear pain, neck stiffness and sinus pressure.   Respiratory: Negative for cough, shortness of breath and wheezing.   Cardiovascular: Negative.   Musculoskeletal: Negative for myalgias.  Neurological: Positive for headaches.    Allergies  Review of patient's allergies indicates no known allergies.  Home Medications   Current Outpatient Rx  Name  Route  Sig  Dispense  Refill  . azithromycin (ZITHROMAX Z-PAK) 250 MG tablet      Take as directed on  pack   6 each   0   . fexofenadine (ALLEGRA) 180 MG tablet   Oral   Take 1 tablet (180 mg total) by mouth daily.   30 tablet   1   . fluticasone (FLONASE) 50 MCG/ACT nasal spray   Nasal   Place 1 spray into the nose 2 (two) times daily.   1 g   2     BP 140/80  Pulse 90  Temp(Src) 98.6 F (37 C) (Oral)  Resp 16  SpO2 99%  Physical Exam  Nursing note and vitals reviewed. Constitutional: She appears well-developed and well-nourished.  tearful  HENT:  Head: Normocephalic.  Right Ear: External ear normal.  Left Ear: External ear normal.  Nose: Nose normal.  TMs normal bilaterally. Tonsils enlarged bilaterally, erythematous, no exudate. Uvula midline.   Eyes: Conjunctivae are normal.  Neck: Normal range of motion. Neck supple.  Cardiovascular: Normal rate, regular rhythm and normal heart sounds.   Pulmonary/Chest: Effort normal and breath sounds normal. No respiratory distress. She has no wheezes. She has no rales.  Lymphadenopathy:    She has no cervical adenopathy.  Skin: Skin is warm and dry.    ED Course  Procedures (including critical care time)  Labs Reviewed - No data to display No results found.   1. Pharyngitis  MDM  Pt with sore throat. No signs of peritonsillar abscess, uvula midline, tonsils are symmetrical. No lymphadenopathy. VS are normal. Pt already taking zithromax, on day 2 just took prior to arrival. Will add pain medication. Pt is tearful in ED.  Will have follow up with pcp.   Filed Vitals:   12/25/12 1209  BP: 140/80  Pulse: 90  Temp: 98.6 F (37 C)  TempSrc: Oral  Resp: 16  SpO2: 99%           Koven Belinsky A Adela Esteban, PA-C 12/25/12 1525

## 2012-12-25 NOTE — ED Provider Notes (Signed)
Medical screening examination/treatment/procedure(s) were performed by non-physician practitioner and as supervising physician I was immediately available for consultation/collaboration.  Lakedra Washington M Anacaren Kohan, MD 12/25/12 1552 

## 2013-02-07 ENCOUNTER — Inpatient Hospital Stay (HOSPITAL_COMMUNITY)
Admission: AD | Admit: 2013-02-07 | Discharge: 2013-02-07 | Disposition: A | Discharge status: Home / Self Care | Payer: Medicaid Other | Source: Ambulatory Visit | Attending: Obstetrics & Gynecology | Admitting: Obstetrics & Gynecology

## 2013-02-07 ENCOUNTER — Encounter (HOSPITAL_COMMUNITY): Payer: Self-pay | Admitting: *Deleted

## 2013-02-07 DIAGNOSIS — R109 Unspecified abdominal pain: Secondary | ICD-10-CM | POA: Insufficient documentation

## 2013-02-07 DIAGNOSIS — N920 Excessive and frequent menstruation with regular cycle: Secondary | ICD-10-CM

## 2013-02-07 DIAGNOSIS — R51 Headache: Secondary | ICD-10-CM | POA: Insufficient documentation

## 2013-02-07 HISTORY — DX: Unspecified abnormal cytological findings in specimens from cervix uteri: R87.619

## 2013-02-07 HISTORY — DX: Reserved for concepts with insufficient information to code with codable children: IMO0002

## 2013-02-07 LAB — URINALYSIS, ROUTINE W REFLEX MICROSCOPIC
Glucose, UA: NEGATIVE mg/dL
Ketones, ur: NEGATIVE mg/dL
Protein, ur: 30 mg/dL — AB
pH: 6.5 (ref 5.0–8.0)

## 2013-02-07 LAB — CBC
Hemoglobin: 12 g/dL (ref 12.0–15.0)
MCH: 28.3 pg (ref 26.0–34.0)
MCHC: 34.2 g/dL (ref 30.0–36.0)
Platelets: 299 10*3/uL (ref 150–400)
RBC: 4.24 MIL/uL (ref 3.87–5.11)

## 2013-02-07 LAB — URINE MICROSCOPIC-ADD ON

## 2013-02-07 LAB — WET PREP, GENITAL: Yeast Wet Prep HPF POC: NONE SEEN

## 2013-02-07 MED ORDER — MEDROXYPROGESTERONE ACETATE 10 MG PO TABS: 10.0000 mg | ORAL_TABLET | Freq: Every day | ORAL | Status: DC

## 2013-02-07 NOTE — MAU Note (Signed)
Patient states she has a history of irregular periods. Started bleeding about 3 weeks ago and became heavy with clots for the past 2 days. Having abdominal pain and headaches for the 3 weeks. Has nausea off and on and has vomited twice.

## 2013-02-07 NOTE — MAU Provider Note (Signed)
History     CSN: [id number]  Arrival date and time: 02/07/13 1518   First Provider Initiated Contact with Patient 02/07/13 1827      Chief Complaint  Patient presents with  . Vaginal Bleeding  . Abdominal Pain  . Headache  . Nausea   HPI  Pt is a GYN patient here with report of vaginal bleeding, abdominal pain, nausea (x2 a week ago), and headache x three weeks.  History of irregular cycles.  Patient's last menstrual period was 01/17/2013.  Pain varies in location "can be on my side, in the middle, it varies".  No current form of birth control method.  Pain is rated a 4/10 at this moment.     Past Medical History  Diagnosis Date  . Gonorrhea   . Chlamydia   . Bacterial vaginal infection   . Yeast vaginitis   . Abnormal Pap smear     Past Surgical History  Procedure Laterality Date  . Cesarean section    . Cesarean section      Family History  Problem Relation Age of Onset  . Asthma Mother   . Diabetes Father   . Hypertension Father     History  Substance Use Topics  . Smoking status: Current Every Day Smoker -- 0.50 packs/day for 3 years    Types: Cigarettes  . Smokeless tobacco: Not on file  . Alcohol Use: Yes     Comment: Occasional    Allergies: No Known Allergies  Prescriptions prior to admission  Medication Sig Dispense Refill  . fluticasone (FLONASE) 50 MCG/ACT nasal spray Place 1 spray into the nose 2 (two) times daily.  1 g  2  . fexofenadine (ALLEGRA) 180 MG tablet Take 1 tablet (180 mg total) by mouth daily.  30 tablet  1    Review of Systems  Gastrointestinal: Positive for nausea and vomiting.  Genitourinary:       Vaginal bleeding  Neurological: Positive for headaches.  All other systems reviewed and are negative.   Physical Exam   Blood pressure 126/72, pulse 89, temperature 98.5 F (36.9 C), temperature source Oral, resp. rate 20, height 5' 3.5" (1.613 m), weight 104.282 kg (229 lb 14.4 oz), last menstrual period 01/17/2013, SpO2  100.00%.  Physical Exam  Constitutional: She is oriented to person, place, and time. She appears well-developed and well-nourished. No distress.  HENT:  Head: Normocephalic.  Neck: Normal range of motion. Neck supple.  Cardiovascular: Normal rate, regular rhythm and normal heart sounds.   Respiratory: Effort normal and breath sounds normal.  GI: Soft. There is no tenderness.  Genitourinary: There is bleeding (negative clots, moderate bleeding) around the vagina.  Musculoskeletal: Normal range of motion. She exhibits no edema.  Neurological: She is alert and oriented to person, place, and time. She has normal reflexes.  Skin: Skin is warm and dry.    MAU Course  Procedures Results for orders placed during the hospital encounter of 02/07/13 (from the past 24 hour(s))  URINALYSIS, ROUTINE W REFLEX MICROSCOPIC     Status: Abnormal   Collection Time    02/07/13  4:20 PM      Result Value Range   Color, Urine RED (*) YELLOW   APPearance CLOUDY (*) CLEAR   Specific Gravity, Urine 1.020  1.005 - 1.030   pH 6.5  5.0 - 8.0   Glucose, UA NEGATIVE  NEGATIVE mg/dL   Hgb urine dipstick LARGE (*) NEGATIVE   Bilirubin Urine NEGATIVE  NEGATIVE   Ketones,  ur NEGATIVE  NEGATIVE mg/dL   Protein, ur 30 (*) NEGATIVE mg/dL   Urobilinogen, UA 0.2  0.0 - 1.0 mg/dL   Nitrite NEGATIVE  NEGATIVE   Leukocytes, UA TRACE (*) NEGATIVE  URINE MICROSCOPIC-ADD ON     Status: None   Collection Time    02/07/13  4:20 PM      Result Value Range   Squamous Epithelial / LPF RARE  RARE   RBC / HPF TOO NUMEROUS TO COUNT  <3 RBC/hpf   Bacteria, UA RARE  RARE   Urine-Other URINALYSIS PERFORMED ON SUPERNATANT    POCT PREGNANCY, URINE     Status: None   Collection Time    02/07/13  4:34 PM      Result Value Range   Preg Test, Ur NEGATIVE  NEGATIVE  CBC     Status: Abnormal   Collection Time    02/07/13  5:15 PM      Result Value Range   WBC 6.8  4.0 - 10.5 K/uL   RBC 4.24  3.87 - 5.11 MIL/uL   Hemoglobin  12.0  12.0 - 15.0 g/dL   HCT 16.1 (*) 09.6 - 04.5 %   MCV 82.8  78.0 - 100.0 fL   MCH 28.3  26.0 - 34.0 pg   MCHC 34.2  30.0 - 36.0 g/dL   RDW 40.9  81.1 - 91.4 %   Platelets 299  150 - 400 K/uL  WET PREP, GENITAL     Status: Abnormal   Collection Time    02/07/13  6:33 PM      Result Value Range   Yeast Wet Prep HPF POC NONE SEEN  NONE SEEN   Trich, Wet Prep NONE SEEN  NONE SEEN   Clue Cells Wet Prep HPF POC NONE SEEN  NONE SEEN   WBC, Wet Prep HPF POC FEW (*) NONE SEEN     Assessment and Plan  Menorrhagia - stable  Plan: DC to home RX Provera 10 mg qd Outpatient ultrasound Follow-up in GYN clinic - message routed.   [facility] 02/07/2013, 6:28 PM

## 2013-02-08 LAB — GC/CHLAMYDIA PROBE AMP: CT Probe RNA: NEGATIVE

## 2013-02-13 ENCOUNTER — Ambulatory Visit (HOSPITAL_COMMUNITY)
Admission: RE | Admit: 2013-02-13 | Discharge: 2013-02-13 | Disposition: A | Discharge status: Home / Self Care | Payer: Medicaid Other | Source: Ambulatory Visit | Attending: Family | Admitting: Family

## 2013-02-13 DIAGNOSIS — N920 Excessive and frequent menstruation with regular cycle: Secondary | ICD-10-CM | POA: Insufficient documentation

## 2013-02-13 DIAGNOSIS — N938 Other specified abnormal uterine and vaginal bleeding: Secondary | ICD-10-CM | POA: Insufficient documentation

## 2013-02-13 DIAGNOSIS — N949 Unspecified condition associated with female genital organs and menstrual cycle: Secondary | ICD-10-CM | POA: Insufficient documentation

## 2013-02-13 DIAGNOSIS — N854 Malposition of uterus: Secondary | ICD-10-CM | POA: Insufficient documentation

## 2013-04-01 ENCOUNTER — Emergency Department (HOSPITAL_COMMUNITY)
Admission: EM | Admit: 2013-04-01 | Discharge: 2013-04-01 | Disposition: A | Discharge status: Home / Self Care | Payer: Medicaid Other | Attending: Emergency Medicine | Admitting: Emergency Medicine

## 2013-04-01 ENCOUNTER — Encounter (HOSPITAL_COMMUNITY): Payer: Self-pay | Admitting: Emergency Medicine

## 2013-04-01 DIAGNOSIS — R059 Cough, unspecified: Secondary | ICD-10-CM | POA: Insufficient documentation

## 2013-04-01 DIAGNOSIS — J029 Acute pharyngitis, unspecified: Secondary | ICD-10-CM | POA: Insufficient documentation

## 2013-04-01 DIAGNOSIS — F17200 Nicotine dependence, unspecified, uncomplicated: Secondary | ICD-10-CM | POA: Insufficient documentation

## 2013-04-01 DIAGNOSIS — IMO0001 Reserved for inherently not codable concepts without codable children: Secondary | ICD-10-CM | POA: Insufficient documentation

## 2013-04-01 DIAGNOSIS — H9209 Otalgia, unspecified ear: Secondary | ICD-10-CM | POA: Insufficient documentation

## 2013-04-01 DIAGNOSIS — Z8619 Personal history of other infectious and parasitic diseases: Secondary | ICD-10-CM | POA: Insufficient documentation

## 2013-04-01 DIAGNOSIS — IMO0002 Reserved for concepts with insufficient information to code with codable children: Secondary | ICD-10-CM | POA: Insufficient documentation

## 2013-04-01 DIAGNOSIS — J329 Chronic sinusitis, unspecified: Secondary | ICD-10-CM | POA: Insufficient documentation

## 2013-04-01 DIAGNOSIS — R5381 Other malaise: Secondary | ICD-10-CM | POA: Insufficient documentation

## 2013-04-01 DIAGNOSIS — Z79899 Other long term (current) drug therapy: Secondary | ICD-10-CM | POA: Insufficient documentation

## 2013-04-01 DIAGNOSIS — R05 Cough: Secondary | ICD-10-CM

## 2013-04-01 DIAGNOSIS — R52 Pain, unspecified: Secondary | ICD-10-CM | POA: Insufficient documentation

## 2013-04-01 DIAGNOSIS — Z91040 Latex allergy status: Secondary | ICD-10-CM | POA: Insufficient documentation

## 2013-04-01 DIAGNOSIS — Z8742 Personal history of other diseases of the female genital tract: Secondary | ICD-10-CM | POA: Insufficient documentation

## 2013-04-01 DIAGNOSIS — R509 Fever, unspecified: Secondary | ICD-10-CM | POA: Insufficient documentation

## 2013-04-01 DIAGNOSIS — J3489 Other specified disorders of nose and nasal sinuses: Secondary | ICD-10-CM | POA: Insufficient documentation

## 2013-04-01 DIAGNOSIS — J069 Acute upper respiratory infection, unspecified: Secondary | ICD-10-CM | POA: Insufficient documentation

## 2013-04-01 MED ORDER — IBUPROFEN 800 MG PO TABS
800.0000 mg | ORAL_TABLET | Freq: Once | ORAL | Status: AC
  Administered 2013-04-01: 800 mg via ORAL
  Filled 2013-04-01 (×2): qty 1

## 2013-04-01 MED ORDER — BENZONATATE 100 MG PO CAPS: 100.0000 mg | ORAL_CAPSULE | Freq: Three times a day (TID) | ORAL | Status: DC

## 2013-04-01 MED ORDER — AZITHROMYCIN 250 MG PO TABS: 250.0000 mg | ORAL_TABLET | Freq: Every day | ORAL | Status: DC

## 2013-04-01 NOTE — ED Provider Notes (Signed)
CSN: [id number]     Arrival date & time 04/01/13  1743 History   First MD Initiated Contact with Patient 04/01/13 1855     Chief Complaint  Patient presents with  . Cough  . Sore Throat  . Otalgia   (Consider location/radiation/quality/duration/timing/severity/associated sxs/prior Treatment) HPI Patient is a 27 year old female who presents emergency department complaining of a nasal congestion, sore throat, cough, sinus pain, and fever. Patient states she has been feeling bad for her last 4 days. She has been taking Robitussin and DayQuil for her symptoms with no relief. Patient states she last took her medicine at 3 PM today. Patient reports generalized body aches. Patient denies any headache, neck pain or stiffness, shortness of breath, abdominal pain, nausea, vomiting. Patient states nothing making her symptoms better or worse. No other complaints. Past Medical History  Diagnosis Date  . Gonorrhea   . Chlamydia   . Bacterial vaginal infection   . Yeast vaginitis   . Abnormal Pap smear    Past Surgical History  Procedure Laterality Date  . Cesarean section    . Cesarean section     Family History  Problem Relation Age of Onset  . Asthma Mother   . Diabetes Father   . Hypertension Father    History  Substance Use Topics  . Smoking status: Current Every Day Smoker -- 0.50 packs/day for 3 years    Types: Cigarettes  . Smokeless tobacco: Not on file  . Alcohol Use: Yes     Comment: Occasional   OB History   Grav Para Term Preterm Abortions TAB SAB Ect Mult Living   2 1 1  1  1   1      Review of Systems  Constitutional: Positive for fever, chills and fatigue.  HENT: Positive for congestion, sore throat and sinus pressure. Negative for neck pain and neck stiffness.   Respiratory: Positive for cough. Negative for chest tightness and shortness of breath.   Cardiovascular: Negative for chest pain, palpitations and leg swelling.  Gastrointestinal: Negative for nausea,  vomiting, abdominal pain and diarrhea.  Genitourinary: Negative for dysuria, flank pain, vaginal bleeding, vaginal discharge, vaginal pain and pelvic pain.  Musculoskeletal: Positive for myalgias. Negative for arthralgias.  Skin: Negative for rash.  Neurological: Negative for dizziness, weakness and headaches.  All other systems reviewed and are negative.    Allergies  Latex  Home Medications   Current Outpatient Rx  Name  Route  Sig  Dispense  Refill  . guaifenesin (ROBITUSSIN) 100 MG/5ML syrup   Oral   Take 200 mg by mouth 3 (three) times daily as needed for cough.         . Pseudoephedrine-APAP-DM (DAYQUIL PO)   Oral   Take 2 capsules by mouth every 6 (six) hours.         . fexofenadine (ALLEGRA) 180 MG tablet   Oral   Take 1 tablet (180 mg total) by mouth daily.   30 tablet   1   . fluticasone (FLONASE) 50 MCG/ACT nasal spray   Nasal   Place 1 spray into the nose 2 (two) times daily.   1 g   2    BP 144/88  Pulse 92  Temp(Src) 100.1 F (37.8 C) (Oral)  Resp 18  SpO2 98% Physical Exam  Nursing note and vitals reviewed. Constitutional: She is oriented to person, place, and time. She appears well-developed and well-nourished. No distress.  HENT:  Head: Normocephalic.  Right Ear: Tympanic membrane, external ear  and ear canal normal.  Left Ear: Tympanic membrane, external ear and ear canal normal.  Nose: Rhinorrhea present. Right sinus exhibits maxillary sinus tenderness and frontal sinus tenderness. Left sinus exhibits maxillary sinus tenderness and frontal sinus tenderness.  Mouth/Throat: Uvula is midline, oropharynx is clear and moist and mucous membranes are normal.  Eyes: Conjunctivae are normal.  Neck: Neck supple.  Cardiovascular: Normal rate, regular rhythm and normal heart sounds.   Pulmonary/Chest: Effort normal and breath sounds normal. No respiratory distress. She has no wheezes. She has no rales.  Abdominal: Soft. Bowel sounds are normal. She  exhibits no distension. There is no tenderness. There is no rebound.  Musculoskeletal: She exhibits no edema.  Neurological: She is alert and oriented to person, place, and time.  Skin: Skin is warm and dry.  Psychiatric: She has a normal mood and affect. Her behavior is normal.    ED Course  Procedures (including critical care time) Labs Review Labs Reviewed - No data to display Imaging Review No results found.  MDM   1. URI (upper respiratory infection)   2. Sinusitis   3. Cough    Patient with upper respiratory infection type symptoms for 4 days. States lot of sinus pressure, green nasal discharge, green mucus when coughing. Patient is febrile in emergency department. No signs of peritonsillar abscess or meningismus. Her lungs are clear on exam. Suspect this is a viral URI however patient insists given her "green mucus" on receiving antibiotics. I had a long discussion with patient regarding the use of antibiotics and viral illnesses. Will give prescription for Z-Pak per her request. Patient instructed to continue nasal saline spray, Tylenol or Motrin for fever, cough medication, rest and drink lots of fluids. Patient voiced understanding.  Filed Vitals:   04/01/13 1829  BP: 144/88  Pulse: 92  Temp: 100.1 F (37.8 C)  TempSrc: Oral  Resp: 18  SpO2: 98%       Lashundra Shiveley A Haja Crego, PA-C 04/01/13 2354

## 2013-04-01 NOTE — ED Notes (Signed)
Pt has been not feeling well with cough, pain in chest when coughing, ear pain, sore throat for the past 2 days now.

## 2013-04-02 NOTE — ED Provider Notes (Signed)
Medical screening examination/treatment/procedure(s) were performed by non-physician practitioner and as supervising physician I was immediately available for consultation/collaboration.   Dagmar Hait, MD 04/02/13 [phone]

## 2013-06-22 ENCOUNTER — Encounter (HOSPITAL_COMMUNITY): Payer: Self-pay | Admitting: Emergency Medicine

## 2013-06-22 ENCOUNTER — Emergency Department (HOSPITAL_COMMUNITY)
Admission: EM | Admit: 2013-06-22 | Discharge: 2013-06-22 | Discharge status: Against Medical Advice | Payer: Medicaid Other | Attending: Emergency Medicine | Admitting: Emergency Medicine

## 2013-06-22 DIAGNOSIS — N760 Acute vaginitis: Secondary | ICD-10-CM | POA: Insufficient documentation

## 2013-06-22 DIAGNOSIS — B373 Candidiasis of vulva and vagina: Secondary | ICD-10-CM | POA: Insufficient documentation

## 2013-06-22 DIAGNOSIS — B9689 Other specified bacterial agents as the cause of diseases classified elsewhere: Secondary | ICD-10-CM | POA: Insufficient documentation

## 2013-06-22 DIAGNOSIS — Z91040 Latex allergy status: Secondary | ICD-10-CM | POA: Insufficient documentation

## 2013-06-22 DIAGNOSIS — F17200 Nicotine dependence, unspecified, uncomplicated: Secondary | ICD-10-CM | POA: Insufficient documentation

## 2013-06-22 DIAGNOSIS — Z79899 Other long term (current) drug therapy: Secondary | ICD-10-CM | POA: Insufficient documentation

## 2013-06-22 DIAGNOSIS — B3731 Acute candidiasis of vulva and vagina: Secondary | ICD-10-CM | POA: Insufficient documentation

## 2013-06-22 DIAGNOSIS — A499 Bacterial infection, unspecified: Secondary | ICD-10-CM | POA: Insufficient documentation

## 2013-06-22 DIAGNOSIS — IMO0002 Reserved for concepts with insufficient information to code with codable children: Secondary | ICD-10-CM | POA: Insufficient documentation

## 2013-06-22 DIAGNOSIS — Z3202 Encounter for pregnancy test, result negative: Secondary | ICD-10-CM | POA: Insufficient documentation

## 2013-06-22 DIAGNOSIS — B379 Candidiasis, unspecified: Secondary | ICD-10-CM

## 2013-06-22 DIAGNOSIS — Z9889 Other specified postprocedural states: Secondary | ICD-10-CM | POA: Insufficient documentation

## 2013-06-22 LAB — POCT PREGNANCY, URINE: Preg Test, Ur: NEGATIVE

## 2013-06-22 LAB — URINALYSIS, ROUTINE W REFLEX MICROSCOPIC
Protein, ur: NEGATIVE mg/dL
Urobilinogen, UA: 0.2 mg/dL (ref 0.0–1.0)

## 2013-06-22 LAB — WET PREP, GENITAL: Trich, Wet Prep: NONE SEEN

## 2013-06-22 LAB — URINE MICROSCOPIC-ADD ON

## 2013-06-22 NOTE — ED Notes (Signed)
C/O one day hx vaginal drainage, reports clear with odor.  Peri area sore when wiping after voiding. Asking about preg test.

## 2013-06-22 NOTE — ED Notes (Signed)
Pt states that she wants to be checked for a yeast infection. Pt states vaginal burning since yesterday as well as discharge.

## 2013-06-22 NOTE — ED Provider Notes (Signed)
CSN: [id number]     Arrival date & time 06/22/13  2440 History   First MD Initiated Contact with Patient 06/22/13 1944     Chief Complaint  Patient presents with  . Vaginitis   (Consider location/radiation/quality/duration/timing/severity/associated sxs/prior Treatment) HPI Comments: The patient is a 27 year-old female with a past medical history of a STI, presenting the Emergency Department with a chief complaint of vaginal discharge for 1 day. She reports associated itching and burning while urination.  She reports a small amount of blood on the toilet tissue after wiping.  The patient reports an increase in white discharge.  She is currently sexually active with two partners.  She reports she has irregular periods and her LNMP was 2 months ago, denies condom use or other contraceptive preventative measures.  She states she thinks it is a yeast infection but has not tried OTC medications.    The history is provided by the patient.    Past Medical History  Diagnosis Date  . Gonorrhea   . Chlamydia   . Bacterial vaginal infection   . Yeast vaginitis   . Abnormal Pap smear    Past Surgical History  Procedure Laterality Date  . Cesarean section    . Cesarean section     Family History  Problem Relation Age of Onset  . Asthma Mother   . Diabetes Father   . Hypertension Father    History  Substance Use Topics  . Smoking status: Current Every Day Smoker -- 0.50 packs/day for 3 years    Types: Cigarettes  . Smokeless tobacco: Not on file  . Alcohol Use: Yes     Comment: Occasional   OB History   Grav Para Term Preterm Abortions TAB SAB Ect Mult Living   2 1 1  1  1   1      Review of Systems  All other systems reviewed and are negative.    Allergies  Latex  Home Medications   Current Outpatient Rx  Name  Route  Sig  Dispense  Refill  . fexofenadine (ALLEGRA) 180 MG tablet   Oral   Take 180 mg by mouth daily as needed for allergies or rhinitis.         .  fluticasone (FLONASE) 50 MCG/ACT nasal spray   Each Nare   Place 2 sprays into both nostrils daily as needed for allergies or rhinitis.          BP 148/88  Pulse 87  Temp(Src) 98.4 F (36.9 C) (Oral)  Resp 20  SpO2 100% Physical Exam  Nursing note and vitals reviewed. Constitutional: She appears well-developed and well-nourished. No distress.  HENT:  Head: Normocephalic and atraumatic.  Neck: Neck supple.  Cardiovascular: Normal rate and regular rhythm.   Pulmonary/Chest: Effort normal and breath sounds normal. No respiratory distress. She has no wheezes. She has no rales.  Abdominal: Soft. Bowel sounds are normal. There is no tenderness. There is no rebound and no guarding.  Genitourinary: There is no rash or lesion on the right labia. There is no rash or lesion on the left labia. Cervix exhibits discharge. Cervix exhibits no motion tenderness and no friability. Right adnexum displays no tenderness. Left adnexum displays no tenderness.  Moderate amount of white discharge in the vagina.  Neurological: She is alert.    ED Course  Procedures (including critical care time) Labs Review Labs Reviewed  URINALYSIS, ROUTINE W REFLEX MICROSCOPIC - Abnormal; Notable for the following:    APPearance CLOUDY (*)  Hgb urine dipstick MODERATE (*)    Leukocytes, UA SMALL (*)    All other components within normal limits  URINE MICROSCOPIC-ADD ON - Abnormal; Notable for the following:    Squamous Epithelial / LPF FEW (*)    Bacteria, UA FEW (*)    All other components within normal limits  URINE CULTURE  POCT PREGNANCY, URINE   Imaging Review No results found.  EKG Interpretation   None       MDM   1. Yeast infection   2. BV (bacterial vaginosis)     Patient with a 1 day history of white vaginal discharge and dysuria.  UA, hCG, wet prep and STD panel ordered.  A few minutes after the pelvic exam the patient reported that she had to pick up her child at 10 and had to leave.   Told the patient she would have to sign out AMA.    Clabe Seal, PA-C 06/25/13 1802  Clabe Seal, PA-C 06/25/13 [phone]

## 2013-06-22 NOTE — ED Notes (Signed)
Pt has to pick up daughter at 10pm.  Elected to sign out AMA stating she will return for results later.

## 2013-06-23 ENCOUNTER — Emergency Department (HOSPITAL_COMMUNITY)
Admission: EM | Admit: 2013-06-23 | Discharge: 2013-06-23 | Disposition: A | Discharge status: Home / Self Care | Payer: Medicaid Other | Source: Home / Self Care | Attending: Emergency Medicine | Admitting: Emergency Medicine

## 2013-06-23 ENCOUNTER — Encounter (HOSPITAL_COMMUNITY): Payer: Self-pay | Admitting: Emergency Medicine

## 2013-06-23 DIAGNOSIS — B9689 Other specified bacterial agents as the cause of diseases classified elsewhere: Secondary | ICD-10-CM

## 2013-06-23 DIAGNOSIS — B379 Candidiasis, unspecified: Secondary | ICD-10-CM

## 2013-06-23 LAB — HIV ANTIBODY (ROUTINE TESTING W REFLEX): HIV: NONREACTIVE

## 2013-06-23 MED ORDER — FLUCONAZOLE 150 MG PO TABS: 150.0000 mg | ORAL_TABLET | Freq: Once | ORAL | Status: DC

## 2013-06-23 MED ORDER — CLINDAMYCIN PHOSPHATE 2 % VA CREA: 1.0000 | TOPICAL_CREAM | Freq: Every day | VAGINAL | Status: DC

## 2013-06-23 NOTE — ED Notes (Signed)
Pt alert, NAD, calm, interactive, resps e/u, speaking in clear complete sentences, denies pain or nausea, requested sprite/soda (given).

## 2013-06-23 NOTE — ED Provider Notes (Signed)
CSN: [id number]     Arrival date & time 06/22/13  2359 History   First MD Initiated Contact with Patient 06/23/13 0147     Chief Complaint  Patient presents with  . yeast infection    (Consider location/radiation/quality/duration/timing/severity/associated sxs/prior Treatment) HPI  Jillian Rowland 27 year old female who returns to the emergency department for results of her workup earlier. Patient was seen earlier for chief complaint of vaginal discharge and itching. Pelvic exam,  UA. Positive results for yeast infection and bacterial vaginosis. Other results are still pending. Patient is negative for pregnancy a urine tract infection. She has no new complaints at this time Past Medical History  Diagnosis Date  . Gonorrhea   . Chlamydia   . Bacterial vaginal infection   . Yeast vaginitis   . Abnormal Pap smear    Past Surgical History  Procedure Laterality Date  . Cesarean section    . Cesarean section     Family History  Problem Relation Age of Onset  . Asthma Mother   . Diabetes Father   . Hypertension Father    History  Substance Use Topics  . Smoking status: Current Every Day Smoker -- 0.50 packs/day for 3 years    Types: Cigarettes  . Smokeless tobacco: Not on file  . Alcohol Use: Yes     Comment: Occasional   OB History   Grav Para Term Preterm Abortions TAB SAB Ect Mult Living   2 1 1  1  1   1      Review of Systems  Constitutional: Negative for fever and chills.  Gastrointestinal: Negative for abdominal pain.  Genitourinary: Positive for vaginal discharge. Negative for dysuria.    Allergies  Latex  Home Medications   Current Outpatient Rx  Name  Route  Sig  Dispense  Refill  . clindamycin (CLEOCIN) 2 % vaginal cream   Vaginal   Place 1 Applicatorful vaginally at bedtime.   40 g   0   . fexofenadine (ALLEGRA) 180 MG tablet   Oral   Take 180 mg by mouth daily as needed for allergies or rhinitis.         . fluconazole (DIFLUCAN) 150 MG  tablet   Oral   Take 1 tablet (150 mg total) by mouth once.   1 tablet   0   . fluticasone (FLONASE) 50 MCG/ACT nasal spray   Each Nare   Place 2 sprays into both nostrils daily as needed for allergies or rhinitis.          BP 126/77  Pulse 93  Temp(Src) 98 F (36.7 C) (Oral)  Resp 18  Ht 5\' 4"  (1.626 m)  Wt 236 lb (107.049 kg)  BMI 40.49 kg/m2  SpO2 98% Physical Exam  Constitutional: She is oriented to person, place, and time. She appears well-developed and well-nourished. No distress.  HENT:  Head: Normocephalic and atraumatic.  Eyes: Conjunctivae are normal.  Neck: Neck supple. No tracheal deviation present.  Cardiovascular: Normal rate and regular rhythm.   Pulmonary/Chest: Effort normal.  Abdominal: Soft. She exhibits no distension.  Neurological: She is alert and oriented to person, place, and time.  Skin: No rash noted.  Psychiatric: Her behavior is normal.    ED Course  Procedures (including critical care time) Labs Review Labs Reviewed - No data to display Imaging Review No results found.  EKG Interpretation   None       MDM   1. Yeast infection   2. BV (bacterial vaginosis)  Patient here for followup patient delete earlier exam and pick her daughter. We'll discharge the patient with Diflucan and Cleocin and start. Patient states that she is made by oral Flagyl and prefers the vaginal cream. Patient is a for discharge at this time. Followup with PCP   Arthor Captain, PA-C 06/23/13 0202

## 2013-06-23 NOTE — ED Notes (Signed)
The pt was here earlier and had tests  But she left withour the results and a doctors note.  She is here to finish up.  She reports she had a yeast iiinfection

## 2013-06-23 NOTE — ED Provider Notes (Signed)
Medical screening examination/treatment/procedure(s) were performed by non-physician practitioner and as supervising physician I was immediately available for consultation/collaboration.    Sunnie Nielsen, MD 06/23/13 [phone]

## 2013-06-24 LAB — GC/CHLAMYDIA PROBE AMP
CT Probe RNA: NEGATIVE
GC Probe RNA: NEGATIVE

## 2013-06-24 LAB — URINE CULTURE

## 2013-06-27 NOTE — ED Provider Notes (Signed)
Medical screening examination/treatment/procedure(s) were performed by non-physician practitioner and as supervising physician I was immediately available for consultation/collaboration.  EKG Interpretation   None         Candyce Churn, MD 06/27/13 1149

## 2013-07-23 IMAGING — US US TRANSVAGINAL NON-OB
1 series · 13 of 25 positions shown · non-contrast
Comparison: None.

CLINICAL DATA: Dysfunctional uterine bleeding.  Menorrhagia.
Pelvic pain.  LMP 01/17/2013.



[Series 1: us pelvis complete · 13 of 65 slices shown]
[im 1/65]
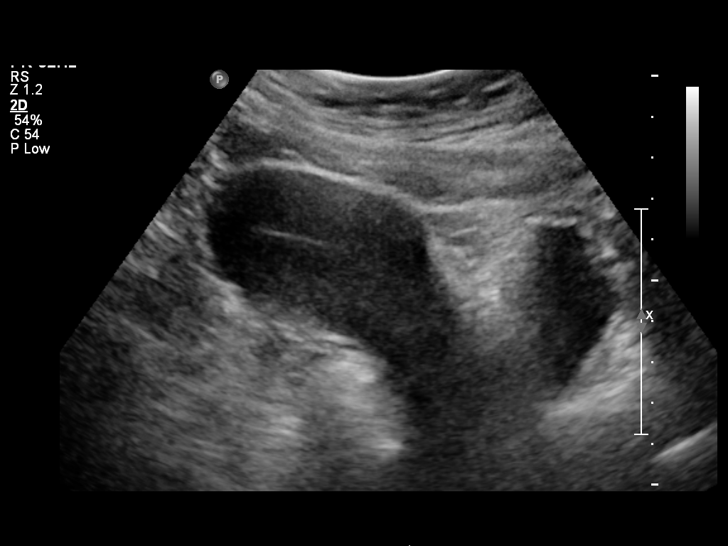
[im 6/65]
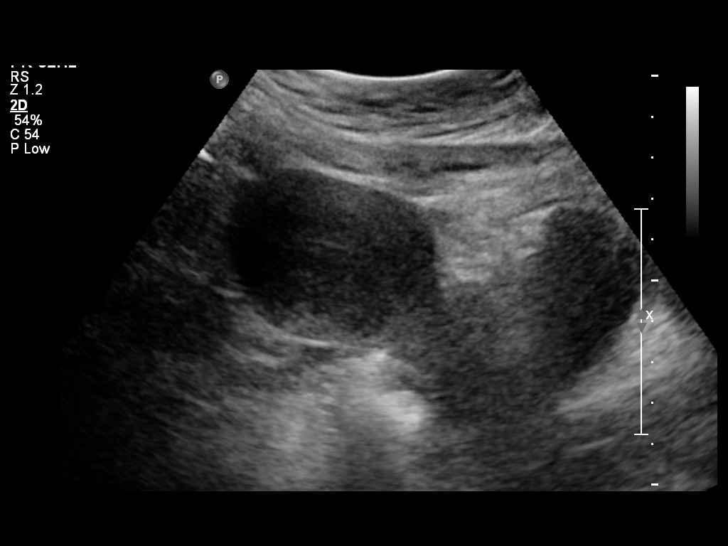
[im 11/65]
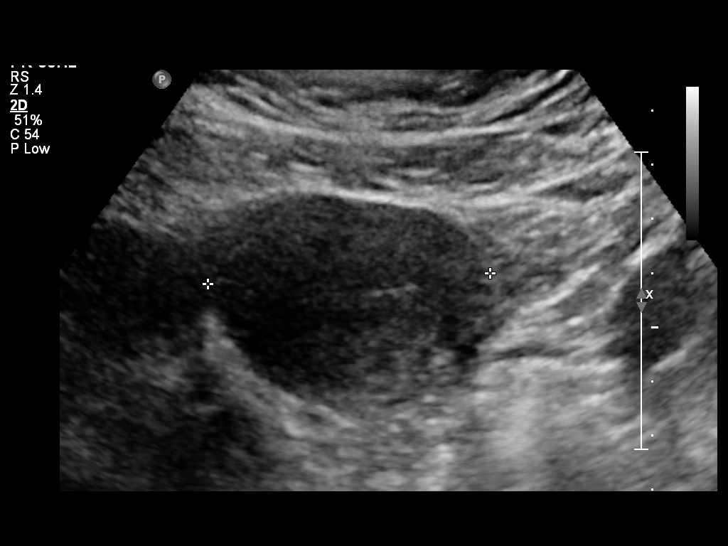
[im 17/65]
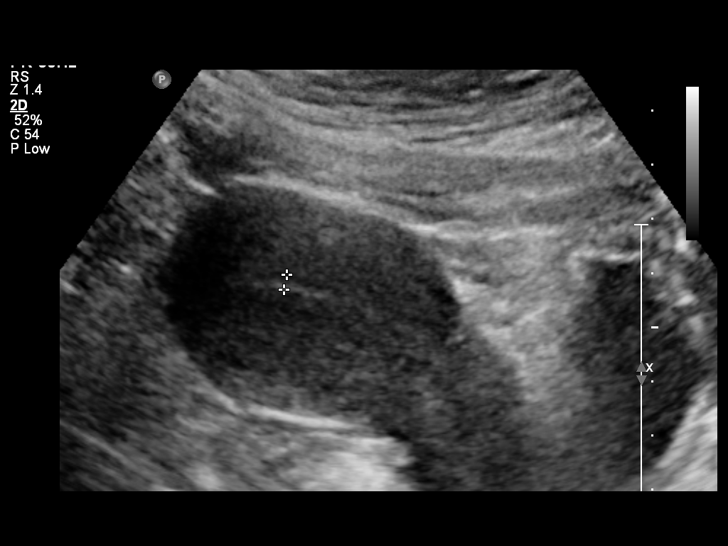
[im 22/65]
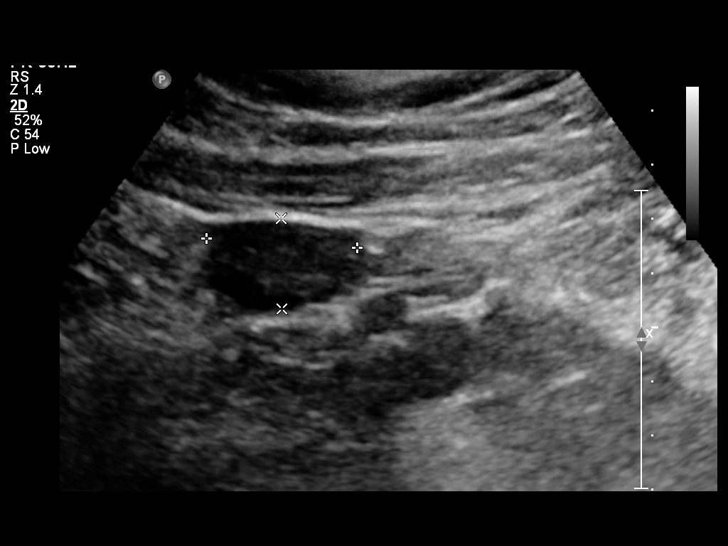
[im 27/65]
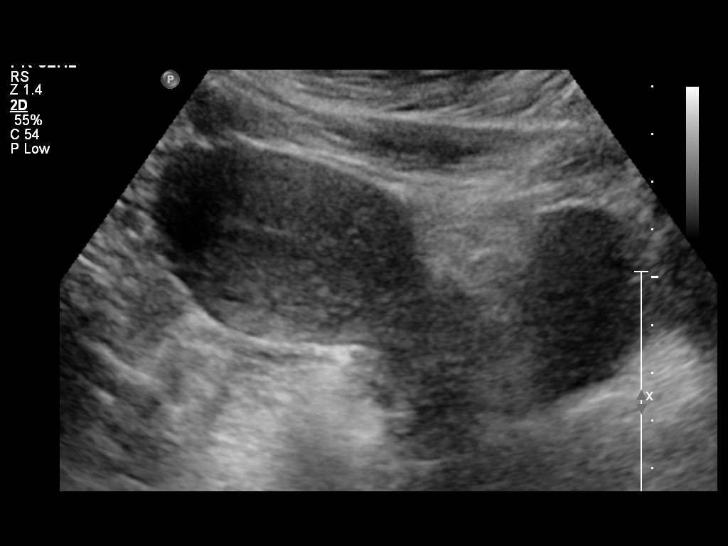
[im 33/65]
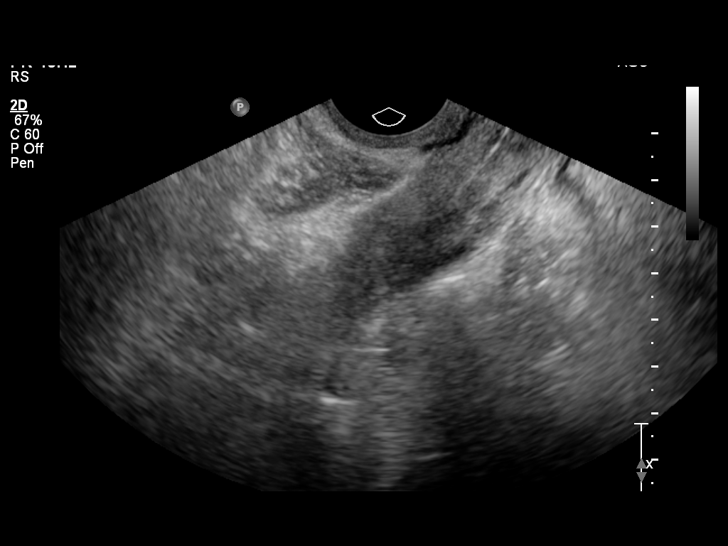
[im 38/65]
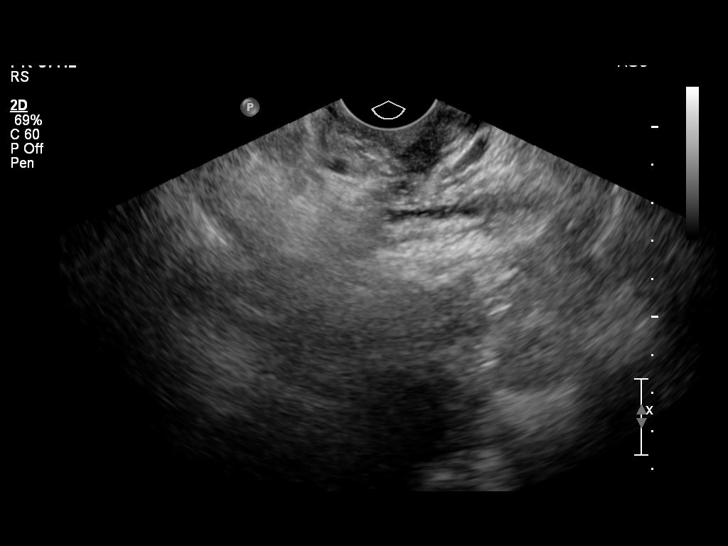
[im 43/65]
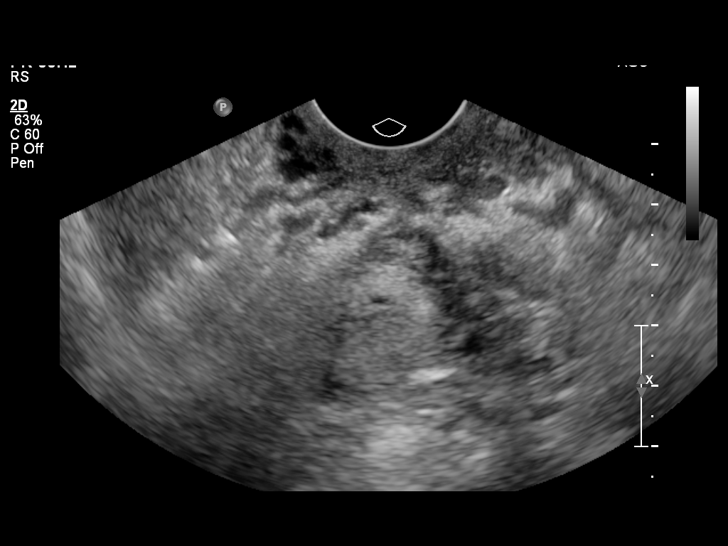
[im 49/65]
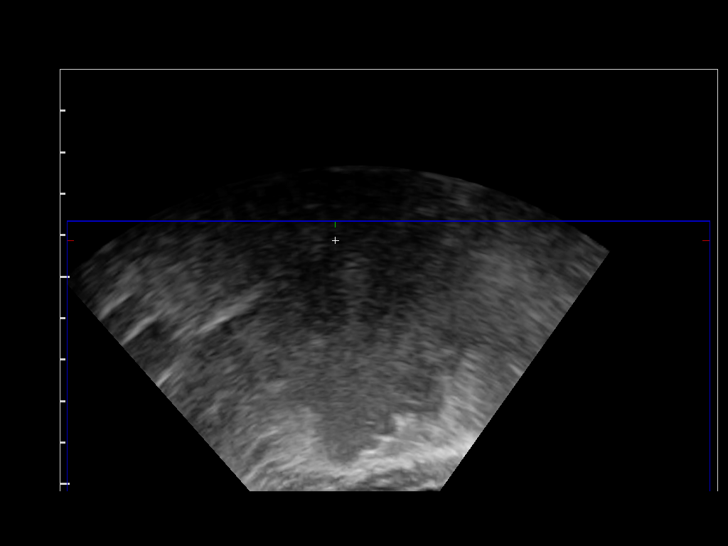
[im 54/65]
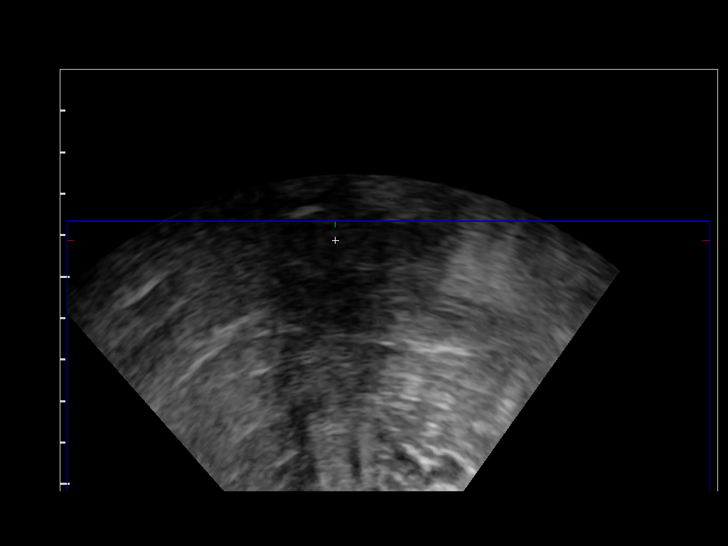
[im 59/65]
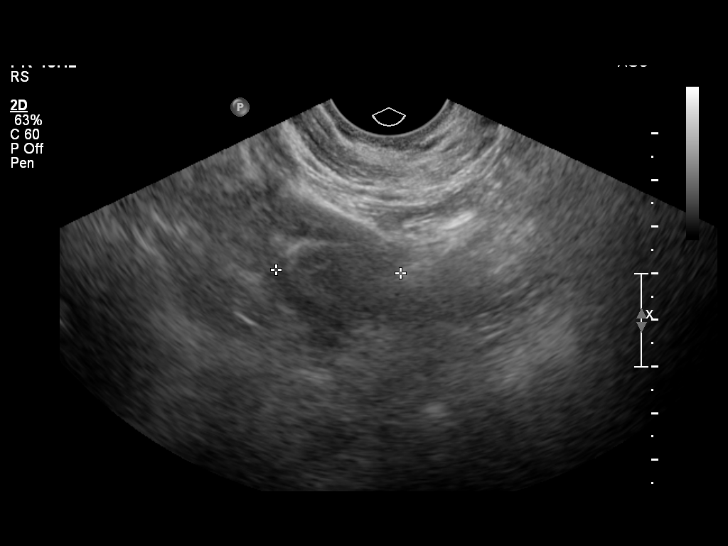
[im 65/65]
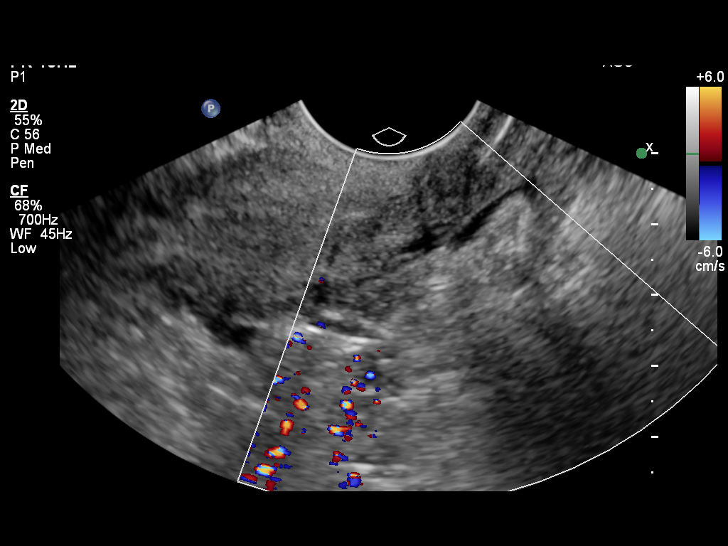

[13 of 25 positions shown; findings below may reference images not displayed]

FINDINGS: Uterus:  10.6 x 4.2 x 4.4 cm.  Mildly retroflexed.  No fibroids or
other uterine masses are identified.

Endometrium: Double layer thickness measures 7 mm transvaginally.
No focal lesion visualized.

Right ovary: 2.8 x 1.7 x 2.4 cm.  Best visualized transabdominally.
Normal appearance.

Left ovary: 2.4 x 1.7 x 2.2 cm.  Normal appearance.

Other Findings:  No free fluid
IMPRESSION: 1.  Normal appearance of uterus.  Endometrial thickness measures 7
mm.  If bleeding remains unresponsive to hormonal or medical
therapy, sonohysterogram should be considered for focal lesion work-
up. (Ref:  Radiological Reasoning: Algorithmic Workup of Abnormal
Vaginal Bleeding with Endovaginal Sonography and Sonohysterography.
AJR [id number]; 191:S68-73)
2.  Normal ovaries.  No evidence of adnexal mass.

## 2013-08-27 ENCOUNTER — Encounter (HOSPITAL_COMMUNITY): Payer: Self-pay | Admitting: Emergency Medicine

## 2013-08-27 ENCOUNTER — Emergency Department (INDEPENDENT_AMBULATORY_CARE_PROVIDER_SITE_OTHER): Payer: Self-pay

## 2013-08-27 ENCOUNTER — Emergency Department (INDEPENDENT_AMBULATORY_CARE_PROVIDER_SITE_OTHER)
Admission: EM | Admit: 2013-08-27 | Discharge: 2013-08-27 | Disposition: A | Discharge status: Home / Self Care | Payer: Self-pay | Source: Home / Self Care | Attending: Family Medicine | Admitting: Family Medicine

## 2013-08-27 DIAGNOSIS — S161XXA Strain of muscle, fascia and tendon at neck level, initial encounter: Secondary | ICD-10-CM

## 2013-08-27 DIAGNOSIS — S139XXA Sprain of joints and ligaments of unspecified parts of neck, initial encounter: Secondary | ICD-10-CM

## 2013-08-27 MED ORDER — HYDROCODONE-ACETAMINOPHEN 5-325 MG PO TABS
ORAL_TABLET | ORAL | Status: AC
  Filled 2013-08-27: qty 2

## 2013-08-27 MED ORDER — HYDROCODONE-ACETAMINOPHEN 5-325 MG PO TABS
2.0000 | ORAL_TABLET | Freq: Once | ORAL | Status: AC
  Administered 2013-08-27: 2 via ORAL

## 2013-08-27 MED ORDER — HYDROCODONE-ACETAMINOPHEN 5-325 MG PO TABS: 1.0000 | ORAL_TABLET | Freq: Four times a day (QID) | ORAL | Status: DC | PRN

## 2013-08-27 MED ORDER — CYCLOBENZAPRINE HCL 10 MG PO TABS: 10.0000 mg | ORAL_TABLET | Freq: Three times a day (TID) | ORAL | Status: DC | PRN
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
## 2013-08-28 NOTE — ED Provider Notes (Signed)
Medical screening examination/treatment/procedure(s) were performed by resident physician or non-physician practitioner and as supervising physician I was immediately available for consultation/collaboration.   Roslind Michaux DOUGLAS MD.   Lynnsey Barbara D England Greb, MD 08/28/13 1859 

## 2014-06-09 ENCOUNTER — Encounter (HOSPITAL_COMMUNITY): Payer: Self-pay | Admitting: Emergency Medicine

## 2014-11-18 ENCOUNTER — Emergency Department (INDEPENDENT_AMBULATORY_CARE_PROVIDER_SITE_OTHER)
Admission: EM | Admit: 2014-11-18 | Discharge: 2014-11-18 | Disposition: A | Discharge status: Home / Self Care | Payer: Self-pay | Source: Home / Self Care | Attending: Family Medicine | Admitting: Family Medicine

## 2014-11-18 ENCOUNTER — Encounter (HOSPITAL_COMMUNITY): Payer: Self-pay | Admitting: *Deleted

## 2014-11-18 DIAGNOSIS — G44209 Tension-type headache, unspecified, not intractable: Secondary | ICD-10-CM

## 2014-11-18 DIAGNOSIS — N390 Urinary tract infection, site not specified: Secondary | ICD-10-CM

## 2014-11-18 HISTORY — DX: Other allergy status, other than to drugs and biological substances: Z91.09

## 2014-11-18 LAB — POCT URINALYSIS DIP (DEVICE)
BILIRUBIN URINE: NEGATIVE
Glucose, UA: NEGATIVE mg/dL
Ketones, ur: NEGATIVE mg/dL
Nitrite: NEGATIVE
PROTEIN: NEGATIVE mg/dL
SPECIFIC GRAVITY, URINE: 1.02 (ref 1.005–1.030)
Urobilinogen, UA: 0.2 mg/dL (ref 0.0–1.0)
pH: 6 (ref 5.0–8.0)

## 2014-11-18 LAB — POCT PREGNANCY, URINE: Preg Test, Ur: NEGATIVE

## 2014-11-18 MED ORDER — KETOROLAC TROMETHAMINE 60 MG/2ML IM SOLN
60.0000 mg | Freq: Once | INTRAMUSCULAR | Status: AC
  Administered 2014-11-18: 60 mg via INTRAMUSCULAR

## 2014-11-18 MED ORDER — DICLOFENAC POTASSIUM 50 MG PO TABS: 50.0000 mg | ORAL_TABLET | Freq: Three times a day (TID) | ORAL | Status: DC

## 2014-11-18 MED ORDER — CEPHALEXIN 500 MG PO CAPS: 500.0000 mg | ORAL_CAPSULE | Freq: Four times a day (QID) | ORAL | Status: DC

## 2014-11-18 MED ORDER — KETOROLAC TROMETHAMINE 60 MG/2ML IM SOLN
INTRAMUSCULAR | Status: AC
  Filled 2014-11-18: qty 2

## 2014-11-18 NOTE — ED Provider Notes (Signed)
CSN: [id number]     Arrival date & time 11/18/14  1828 History   First MD Initiated Contact with Patient 11/18/14 2030     Chief Complaint  Patient presents with  . Urinary Tract Infection   (Consider location/radiation/quality/duration/timing/severity/associated sxs/prior Treatment) HPI Comments: 29 year old obese female complaining of dysuria for 2 weeks. She has urinary frequency and has to awaken during the night several times to urinate. Her second complaint is that of headaches every day. She says these are the worst headaches she has ever had in her life. They occur on a  daily basis for 2 months. The pain is located in the bitemporal areas. Occasionally is in the occiput and down the back of the neck. She has taken some OTC meds with partial and temporary relief. She denies problems with vision although today she had a transient episode of blurred vision when bending over. Denies problems with speech hearing or swallowing or focal weakness . Denies photophobia or nausea. 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 She states that her headache gets worse during this time. She also has pain in the back of her head and neck during this time.   Past Medical History  Diagnosis Date  . Gonorrhea   . Chlamydia   . Bacterial vaginal infection   . Yeast vaginitis   . Abnormal Pap smear   . Pollen allergy    Past Surgical History  Procedure Laterality Date  . Cesarean section  2009    x 1   Family History  Problem Relation Age of Onset  . Asthma Mother   . Diabetes Father   . Hypertension Father    History  Substance Use Topics  . Smoking status: Current Every Day Smoker -- 0.50 packs/day for 3 years    Types: Cigarettes  . Smokeless tobacco: Not on file  . Alcohol Use: Yes     Comment: Occasional   OB History    Gravida Para Term Preterm AB TAB SAB Ectopic Multiple Living   2 1 1  1  1   1      Review of Systems   Constitutional: Positive for activity change. Negative for fever.  HENT: Negative for congestion, postnasal drip, rhinorrhea, sneezing, sore throat, tinnitus and trouble swallowing.   Eyes: Negative for photophobia, pain, discharge and itching.  Respiratory: Negative for cough, shortness of breath and wheezing.   Cardiovascular: Negative.   Gastrointestinal: Negative for vomiting, abdominal pain, diarrhea and constipation.       She had transient mild nausea once this morning only.  Genitourinary: Positive for dysuria, urgency and frequency. Negative for pelvic pain.  Musculoskeletal: Positive for neck pain. Negative for neck stiffness.  Skin: Negative.   Neurological: Positive for dizziness. Negative for tremors, seizures, syncope, speech difficulty, weakness and numbness.  Psychiatric/Behavioral: Negative.     Allergies  Latex  Home Medications   Prior to Admission medications   Medication Sig Start Date End Date Taking? Authorizing Provider  cephALEXin (KEFLEX) 500 MG capsule Take 1 capsule (500 mg total) by mouth 4 (four) times daily. 11/18/14   Hayden Rasmussenavid Sugey Trevathan, NP  diclofenac (CATAFLAM) 50 MG tablet Take 1 tablet (50 mg total) by mouth 3 (three) times daily. One tablet TID with food prn pain. 11/18/14   Hayden Rasmussenavid Maleek Craver, NP   BP 135/96 mmHg  Pulse 77  Temp(Src) 98.8 F (37.1 C) (Oral)  Resp 16  SpO2 97%  LMP 10/11/2014  Physical Exam  Constitutional: She is oriented to person, place, and time. She appears well-developed and well-nourished. No distress.  HENT:  Head: Normocephalic and atraumatic.  Mouth/Throat: Oropharynx is clear and moist. No oropharyngeal exudate.  There is marked tenderness over the temple musculature. Is also tenderness to the splenius capitis muscles and the occipital scalp. Bilateral TMs are normal. Uvula midline. Soft palate rises symmetrically.  Eyes: EOM are normal. Pupils are equal, round, and reactive to light.  Neck: Normal range of motion. Neck supple.   Cardiovascular: Normal rate and normal heart sounds.   Pulmonary/Chest: Effort normal and breath sounds normal. No respiratory distress.  Abdominal: Soft. There is no tenderness.  Musculoskeletal: Normal range of motion.  Lymphadenopathy:    She has no cervical adenopathy.  Neurological: She is alert and oriented to person, place, and time. No cranial nerve deficit. She exhibits normal muscle tone. Coordination normal.  No dysmetria. Romberg is negative. Heel to toe gait is balanced and smooth Strength is 5 over 5 and symmetric  Skin: Skin is warm and dry.  Psychiatric: She has a normal mood and affect.  Nursing note and vitals reviewed.   ED Course  Procedures (including critical care time) Labs Review Labs Reviewed  POCT URINALYSIS DIP (DEVICE) - Abnormal; Notable for the following:    Hgb urine dipstick TRACE (*)    Leukocytes, UA TRACE (*)    All other components within normal limits  URINE CULTURE  POCT PREGNANCY, URINE   Results for orders placed or performed during the hospital encounter of 11/18/14  POCT urinalysis dip (device)  Result Value Ref Range   Glucose, UA NEGATIVE NEGATIVE mg/dL   Bilirubin Urine NEGATIVE NEGATIVE   Ketones, ur NEGATIVE NEGATIVE mg/dL   Specific Gravity, Urine 1.020 1.005 - 1.030   Hgb urine dipstick TRACE (A) NEGATIVE   pH 6.0 5.0 - 8.0   Protein, ur NEGATIVE NEGATIVE mg/dL   Urobilinogen, UA 0.2 0.0 - 1.0 mg/dL   Nitrite NEGATIVE NEGATIVE   Leukocytes, UA TRACE (A) NEGATIVE  Pregnancy, urine POC  Result Value Ref Range   Preg Test, Ur NEGATIVE NEGATIVE    Imaging Review No results found.   MDM   1. Tension headache   2. UTI (lower urinary tract infection)    Urine culture pending Keflex for UTI Neurologic exam is grossly intact. This appears to be a tension-type headache. Toradol 60 mg IM now Cataflam 50 mg 3 times a day when necessary headache. Recommend taking his fever is possible. Take with food. Follow-up with your  PCP as soon as possible. For any new symptoms problems or worsening may return or go to the emergency department.    Hayden Rasmussen, NP 11/18/14 2139

## 2014-11-18 NOTE — ED Notes (Addendum)
C/o low abdominal pain when she urinates onset 2 weeks ago. No hematuria. Has urinary frequency at night and urgency during the day.  C/o vaginal odor x 2 weeks.  Also c/o really bad headaches for 1 month.  Has tried Excedrin Migraine, Aleve and Bayer Aspirin. H/A worse in the morning.  When she bends over to tie her shoes her vision gets blurry and feels dizzy.  Tylenol or Ibuprofen does not work.

## 2014-11-18 NOTE — Discharge Instructions (Signed)
Headaches, Frequently Asked Questions °MIGRAINE HEADACHES °Q: What is migraine? What causes it? How can I treat it? °A: Generally, migraine headaches begin as a dull ache. Then they develop into a constant, throbbing, and pulsating pain. You may experience pain at the temples. You may experience pain at the front or back of one or both sides of the head. The pain is usually accompanied by a combination of: °· Nausea. °· Vomiting. °· Sensitivity to light and noise. °Some people (about 15%) experience an aura (see below) before an attack. The cause of migraine is believed to be chemical reactions in the brain. Treatment for migraine may include over-the-counter or prescription medications. It may also include self-help techniques. These include relaxation training and biofeedback.  °Q: What is an aura? °A: About 15% of people with migraine get an "aura". This is a sign of neurological symptoms that occur before a migraine headache. You may see wavy or jagged lines, dots, or flashing lights. You might experience tunnel vision or blind spots in one or both eyes. The aura can include visual or auditory hallucinations (something imagined). It may include disruptions in smell (such as strange odors), taste or touch. Other symptoms include: °· Numbness. °· A "pins and needles" sensation. °· Difficulty in recalling or speaking the correct word. °These neurological events may last as long as 60 minutes. These symptoms will fade as the headache begins. °Q: What is a trigger? °A: Certain physical or environmental factors can lead to or "trigger" a migraine. These include: °· Foods. °· Hormonal changes. °· Weather. °· Stress. °It is important to remember that triggers are different for everyone. To help prevent migraine attacks, you need to figure out which triggers affect you. Keep a headache diary. This is a good way to track triggers. The diary will help you talk to your healthcare professional about your condition. °Q: Does  weather affect migraines? °A: Bright sunshine, hot, humid conditions, and drastic changes in barometric pressure may lead to, or "trigger," a migraine attack in some people. But studies have shown that weather does not act as a trigger for everyone with migraines. °Q: What is the link between migraine and hormones? °A: Hormones start and regulate many of your body's functions. Hormones keep your body in balance within a constantly changing environment. The levels of hormones in your body are unbalanced at times. Examples are during menstruation, pregnancy, or menopause. That can lead to a migraine attack. In fact, about three quarters of all women with migraine report that their attacks are related to the menstrual cycle.  °Q: Is there an increased risk of stroke for migraine sufferers? °A: The likelihood of a migraine attack causing a stroke is very remote. That is not to say that migraine sufferers cannot have a stroke associated with their migraines. In persons under age 40, the most common associated factor for stroke is migraine headache. But over the course of a person's normal life span, the occurrence of migraine headache may actually be associated with a reduced risk of dying from cerebrovascular disease due to stroke.  °Q: What are acute medications for migraine? °A: Acute medications are used to treat the pain of the headache after it has started. Examples over-the-counter medications, NSAIDs, ergots, and triptans.  °Q: What are the triptans? °A: Triptans are the newest class of abortive medications. They are specifically targeted to treat migraine. Triptans are vasoconstrictors. They moderate some chemical reactions in the brain. The triptans work on receptors in your brain. Triptans help   to restore the balance of a neurotransmitter called serotonin. Fluctuations in levels of serotonin are thought to be a main cause of migraine.  °Q: Are over-the-counter medications for migraine effective? °A:  Over-the-counter, or "OTC," medications may be effective in relieving mild to moderate pain and associated symptoms of migraine. But you should see your caregiver before beginning any treatment regimen for migraine.  °Q: What are preventive medications for migraine? °A: Preventive medications for migraine are sometimes referred to as "prophylactic" treatments. They are used to reduce the frequency, severity, and length of migraine attacks. Examples of preventive medications include antiepileptic medications, antidepressants, beta-blockers, calcium channel blockers, and NSAIDs (nonsteroidal anti-inflammatory drugs). °Q: Why are anticonvulsants used to treat migraine? °A: During the past few years, there has been an increased interest in antiepileptic drugs for the prevention of migraine. They are sometimes referred to as "anticonvulsants". Both epilepsy and migraine may be caused by similar reactions in the brain.  °Q: Why are antidepressants used to treat migraine? °A: Antidepressants are typically used to treat people with depression. They may reduce migraine frequency by regulating chemical levels, such as serotonin, in the brain.  °Q: What alternative therapies are used to treat migraine? °A: The term "alternative therapies" is often used to describe treatments considered outside the scope of conventional Western medicine. Examples of alternative therapy include acupuncture, acupressure, and yoga. Another common alternative treatment is herbal therapy. Some herbs are believed to relieve headache pain. Always discuss alternative therapies with your caregiver before proceeding. Some herbal products contain arsenic and other toxins. °TENSION HEADACHES °Q: What is a tension-type headache? What causes it? How can I treat it? °A: Tension-type headaches occur randomly. They are often the result of temporary stress, anxiety, fatigue, or anger. Symptoms include soreness in your temples, a tightening band-like sensation  around your head (a "vice-like" ache). Symptoms can also include a pulling feeling, pressure sensations, and contracting head and neck muscles. The headache begins in your forehead, temples, or the back of your head and neck. Treatment for tension-type headache may include over-the-counter or prescription medications. Treatment may also include self-help techniques such as relaxation training and biofeedback. °CLUSTER HEADACHES °Q: What is a cluster headache? What causes it? How can I treat it? °A: Cluster headache gets its name because the attacks come in groups. The pain arrives with little, if any, warning. It is usually on one side of the head. A tearing or bloodshot eye and a runny nose on the same side of the headache may also accompany the pain. Cluster headaches are believed to be caused by chemical reactions in the brain. They have been described as the most severe and intense of any headache type. Treatment for cluster headache includes prescription medication and oxygen. °SINUS HEADACHES °Q: What is a sinus headache? What causes it? How can I treat it? °A: When a cavity in the bones of the face and skull (a sinus) becomes inflamed, the inflammation will cause localized pain. This condition is usually the result of an allergic reaction, a tumor, or an infection. If your headache is caused by a sinus blockage, such as an infection, you will probably have a fever. An x-ray will confirm a sinus blockage. Your caregiver's treatment might include antibiotics for the infection, as well as antihistamines or decongestants.  °REBOUND HEADACHES °Q: What is a rebound headache? What causes it? How can I treat it? °A: A pattern of taking acute headache medications too often can lead to a condition known as "rebound headache."   A pattern of taking too much headache medication includes taking it more than 2 days per week or in excessive amounts. That means more than the label or a caregiver advises. With rebound  headaches, your medications not only stop relieving pain, they actually begin to cause headaches. Doctors treat rebound headache by tapering the medication that is being overused. Sometimes your caregiver will gradually substitute a different type of treatment or medication. Stopping may be a challenge. Regularly overusing a medication increases the potential for serious side effects. Consult a caregiver if you regularly use headache medications more than 2 days per week or more than the label advises. ADDITIONAL QUESTIONS AND ANSWERS Q: What is biofeedback? A: Biofeedback is a self-help treatment. Biofeedback uses special equipment to monitor your body's involuntary physical responses. Biofeedback monitors:  Breathing.  Pulse.  Heart rate.  Temperature.  Muscle tension.  Brain activity. Biofeedback helps you refine and perfect your relaxation exercises. You learn to control the physical responses that are related to stress. Once the technique has been mastered, you do not need the equipment any more. Q: Are headaches hereditary? A: Four out of five (80%) of people that suffer report a family history of migraine. Scientists are not sure if this is genetic or a family predisposition. Despite the uncertainty, a child has a 50% chance of having migraine if one parent suffers. The child has a 75% chance if both parents suffer.  Q: Can children get headaches? A: By the time they reach high school, most young people have experienced some type of headache. Many safe and effective approaches or medications can prevent a headache from occurring or stop it after it has begun.  Q: What type of doctor should I see to diagnose and treat my headache? A: Start with your primary caregiver. Discuss his or her experience and approach to headaches. Discuss methods of classification, diagnosis, and treatment. Your caregiver may decide to recommend you to a headache specialist, depending upon your symptoms or other  physical conditions. Having diabetes, allergies, etc., may require a more comprehensive and inclusive approach to your headache. The National Headache Foundation will provide, upon request, a list of [facility] physician members in your state. Document Released: 10/15/2003 Document Revised: 10/17/2011 Document Reviewed: 03/24/2008 [facility] Patient Information 2015 [place]. This information is not intended to replace advice given to you by your health care provider. Make sure you discuss any questions you have with your health care provider.  Tension Headache A tension headache is a feeling of pain, pressure, or aching often felt over the front and sides of the head. The pain can be dull or can feel tight (constricting). It is the most common type of headache. Tension headaches are not normally associated with nausea or vomiting and do not get worse with physical activity. Tension headaches can last 30 minutes to several days.  CAUSES  The exact cause is not known, but it may be caused by chemicals and hormones in the brain that lead to pain. Tension headaches often begin after stress, anxiety, or depression. Other triggers may include:  Alcohol.  Caffeine (too much or withdrawal).  Respiratory infections (colds, flu, sinus infections).  Dental problems or teeth clenching.  Fatigue.  Holding your head and neck in one position too long while using a computer. SYMPTOMS   Pressure around the head.   Dull, aching head pain.   Pain felt over the front and sides of the head.   Tenderness in the muscles of the head,  neck, and shoulders. DIAGNOSIS  A tension headache is often diagnosed based on:   Symptoms.   Physical examination.   A CT scan or MRI of your head. These tests may be ordered if symptoms are severe or unusual. TREATMENT  Medicines may be given to help relieve symptoms.  HOME CARE INSTRUCTIONS   Only take over-the-counter or prescription medicines for pain or  discomfort as directed by your caregiver.   Lie down in a dark, quiet room when you have a headache.   Keep a journal to find out what may be triggering your headaches. For example, write down:  What you eat and drink.  How much sleep you get.  Any change to your diet or medicines.  Try massage or other relaxation techniques.   Ice packs or heat applied to the head and neck can be used. Use these 3 to 4 times per day for 15 to 20 minutes each time, or as needed.   Limit stress.   Sit up straight, and do not tense your muscles.   Quit smoking if you smoke.  Limit alcohol use.  Decrease the amount of caffeine you drink, or stop drinking caffeine.  Eat and exercise regularly.  Get 7 to 9 hours of sleep, or as recommended by your caregiver.  Avoid excessive use of pain medicine as recurrent headaches can occur.  SEEK MEDICAL CARE IF:   You have problems with the medicines you were prescribed.  Your medicines do not work.  You have a change from the usual headache.  You have nausea or vomiting. SEEK IMMEDIATE MEDICAL CARE IF:   Your headache becomes severe.  You have a fever.  You have a stiff neck.  You have loss of vision.  You have muscular weakness or loss of muscle control.  You lose your balance or have trouble walking.  You feel faint or pass out.  You have severe symptoms that are different from your first symptoms. MAKE SURE YOU:   Understand these instructions.  Will watch your condition.  Will get help right away if you are not doing well or get worse. Document Released: 07/25/2005 Document Revised: 10/17/2011 Document Reviewed: 07/15/2011 [facility] Patient Information 2015 [place], [place]. This information is not intended to replace advice given to you by your health care provider. Make sure you discuss any questions you have with your health care provider.  Urinary Tract Infection Urinary tract infections (UTIs) can develop anywhere  along your urinary tract. Your urinary tract is your body's drainage system for removing wastes and extra water. Your urinary tract includes two kidneys, two ureters, a bladder, and a urethra. Your kidneys are a pair of bean-shaped organs. Each kidney is about the size of your fist. They are located below your ribs, one on each side of your spine. CAUSES Infections are caused by microbes, which are microscopic organisms, including fungi, viruses, and bacteria. These organisms are so small that they can only be seen through a microscope. Bacteria are the microbes that most commonly cause UTIs. SYMPTOMS  Symptoms of UTIs may vary by age and gender of the patient and by the location of the infection. Symptoms in young women typically include a frequent and intense urge to urinate and a painful, burning feeling in the bladder or urethra during urination. Older women and men are more likely to be tired, shaky, and weak and have muscle aches and abdominal pain. A fever may mean the infection is in your kidneys. Other symptoms of a  kidney infection include pain in your back or sides below the ribs, nausea, and vomiting. DIAGNOSIS To diagnose a UTI, your caregiver will ask you about your symptoms. Your caregiver also will ask to provide a urine sample. The urine sample will be tested for bacteria and white blood cells. White blood cells are made by your body to help fight infection. TREATMENT  Typically, UTIs can be treated with medication. Because most UTIs are caused by a bacterial infection, they usually can be treated with the use of antibiotics. The choice of antibiotic and length of treatment depend on your symptoms and the type of bacteria causing your infection. HOME CARE INSTRUCTIONS  If you were prescribed antibiotics, take them exactly as your caregiver instructs you. Finish the medication even if you feel better after you have only taken some of the medication.  Drink enough water and fluids to keep  your urine clear or pale yellow.  Avoid caffeine, tea, and carbonated beverages. They tend to irritate your bladder.  Empty your bladder often. Avoid holding urine for long periods of time.  Empty your bladder before and after sexual intercourse.  After a bowel movement, women should cleanse from front to back. Use each tissue only once. SEEK MEDICAL CARE IF:   You have back pain.  You develop a fever.  Your symptoms do not begin to resolve within 3 days. SEEK IMMEDIATE MEDICAL CARE IF:   You have severe back pain or lower abdominal pain.  You develop chills.  You have nausea or vomiting.  You have continued burning or discomfort with urination. MAKE SURE YOU:   Understand these instructions.  Will watch your condition.  Will get help right away if you are not doing well or get worse. Document Released: 05/04/2005 Document Revised: 01/24/2012 Document Reviewed: 09/02/2011 [facility] Patient Information 2015 [place], [place]. This information is not intended to replace advice given to you by your health care provider. Make sure you discuss any questions you have with your health care provider.

## 2014-11-20 LAB — URINE CULTURE
Colony Count: 75000
Special Requests: NORMAL

## 2014-12-31 ENCOUNTER — Other Ambulatory Visit: Payer: Self-pay

## 2015-01-01 LAB — CYTOLOGY - PAP

## 2015-04-26 ENCOUNTER — Encounter (HOSPITAL_COMMUNITY): Payer: Self-pay | Admitting: Emergency Medicine

## 2015-04-26 ENCOUNTER — Emergency Department (HOSPITAL_COMMUNITY)
Admission: EM | Admit: 2015-04-26 | Discharge: 2015-04-27 | Disposition: A | Discharge status: Home / Self Care | Payer: Medicaid Other | Attending: Emergency Medicine | Admitting: Emergency Medicine

## 2015-04-26 DIAGNOSIS — B373 Candidiasis of vulva and vagina: Secondary | ICD-10-CM | POA: Insufficient documentation

## 2015-04-26 DIAGNOSIS — Z720 Tobacco use: Secondary | ICD-10-CM | POA: Insufficient documentation

## 2015-04-26 DIAGNOSIS — Z8709 Personal history of other diseases of the respiratory system: Secondary | ICD-10-CM | POA: Diagnosis not present

## 2015-04-26 DIAGNOSIS — N898 Other specified noninflammatory disorders of vagina: Secondary | ICD-10-CM | POA: Diagnosis present

## 2015-04-26 DIAGNOSIS — Z3202 Encounter for pregnancy test, result negative: Secondary | ICD-10-CM | POA: Diagnosis not present

## 2015-04-26 DIAGNOSIS — Z91040 Latex allergy status: Secondary | ICD-10-CM | POA: Diagnosis not present

## 2015-04-26 DIAGNOSIS — B3731 Acute candidiasis of vulva and vagina: Secondary | ICD-10-CM

## 2015-04-26 NOTE — ED Notes (Signed)
Pt states she has had thick white discharge and burning when she urinates x 1 week. Pt is having unprotected sex and wants to get checked for STDs. Alert and oriented.

## 2015-04-27 LAB — URINALYSIS, ROUTINE W REFLEX MICROSCOPIC
Bilirubin Urine: NEGATIVE
Glucose, UA: NEGATIVE mg/dL
Ketones, ur: NEGATIVE mg/dL
NITRITE: NEGATIVE
PROTEIN: 30 mg/dL — AB
Specific Gravity, Urine: 1.022 (ref 1.005–1.030)
UROBILINOGEN UA: 1 mg/dL (ref 0.0–1.0)
pH: 5.5 (ref 5.0–8.0)

## 2015-04-27 LAB — HIV ANTIBODY (ROUTINE TESTING W REFLEX): HIV SCREEN 4TH GENERATION: NONREACTIVE

## 2015-04-27 LAB — URINE MICROSCOPIC-ADD ON

## 2015-04-27 LAB — WET PREP, GENITAL
Clue Cells Wet Prep HPF POC: NONE SEEN
Trich, Wet Prep: NONE SEEN

## 2015-04-27 LAB — POC URINE PREG, ED: Preg Test, Ur: NEGATIVE

## 2015-04-27 LAB — GC/CHLAMYDIA PROBE AMP (~~LOC~~) NOT AT ARMC
Chlamydia: NEGATIVE
Neisseria Gonorrhea: NEGATIVE

## 2015-04-27 MED ORDER — AZITHROMYCIN 250 MG PO TABS
1000.0000 mg | ORAL_TABLET | Freq: Once | ORAL | Status: AC
  Administered 2015-04-27: 1000 mg via ORAL
  Filled 2015-04-27: qty 4

## 2015-04-27 MED ORDER — FLUCONAZOLE 150 MG PO TABS
150.0000 mg | ORAL_TABLET | Freq: Once | ORAL | Status: AC
  Administered 2015-04-27: 150 mg via ORAL
  Filled 2015-04-27: qty 1

## 2015-04-27 MED ORDER — CEFTRIAXONE SODIUM 250 MG IJ SOLR
250.0000 mg | Freq: Once | INTRAMUSCULAR | Status: AC
  Administered 2015-04-27: 250 mg via INTRAMUSCULAR
  Filled 2015-04-27: qty 250

## 2015-04-27 MED ORDER — LIDOCAINE HCL (PF) 1 % IJ SOLN
INTRAMUSCULAR | Status: AC
  Administered 2015-04-27: 2.1 mL
  Filled 2015-04-27: qty 5

## 2015-04-27 NOTE — ED Provider Notes (Signed)
CSN: [id number]     Arrival date & time 04/26/15  2338 History   First MD Initiated Contact with Patient 04/27/15 0007     Chief Complaint  Patient presents with  . Vaginal Discharge     (Consider location/radiation/quality/duration/timing/severity/associated sxs/prior Treatment) Patient is a 29 y.o. female presenting with vaginal discharge. The history is provided by the patient. No language interpreter was used.  Vaginal Discharge Quality:  White Severity:  Moderate Onset quality:  Gradual Duration:  1 week Timing:  Constant Progression:  Unchanged Chronicity:  New Context: after intercourse   Relieved by:  Nothing Worsened by:  Nothing tried Ineffective treatments:  None tried Associated symptoms: dysuria   Associated symptoms: no abdominal pain, no dyspareunia, no genital lesions, no nausea, no urinary frequency, no urinary hesitancy, no urinary incontinence and no vomiting   Risk factors: unprotected sex   Risk factors: no endometriosis, no foreign body, no gynecological surgery, no new sexual partner, no PID, no STI and no STI exposure     Past Medical History  Diagnosis Date  . Gonorrhea   . Chlamydia   . Bacterial vaginal infection   . Yeast vaginitis   . Abnormal Pap smear   . Pollen allergy    Past Surgical History  Procedure Laterality Date  . Cesarean section  2009    x 1   Family History  Problem Relation Age of Onset  . Asthma Mother   . Diabetes Father   . Hypertension Father    Social History  Substance Use Topics  . Smoking status: Current Every Day Smoker -- 0.50 packs/day for 3 years    Types: Cigarettes  . Smokeless tobacco: None  . Alcohol Use: Yes     Comment: Occasional   OB History    Gravida Para Term Preterm AB TAB SAB Ectopic Multiple Living   Review of Systems  Gastrointestinal: Negative for nausea, vomiting and abdominal pain.  Genitourinary: Positive for dysuria and vaginal discharge. Negative for  bladder incontinence, hesitancy and dyspareunia.  All other systems reviewed and are negative.     Allergies  Latex  Home Medications   Prior to Admission medications   Medication Sig Start Date End Date Taking? Authorizing Provider  cephALEXin (KEFLEX) 500 MG capsule Take 1 capsule (500 mg total) by mouth 4 (four) times daily. Patient not taking: Reported on 04/27/2015 11/18/14   Hayden Rasmussen, NP  diclofenac (CATAFLAM) 50 MG tablet Take 1 tablet (50 mg total) by mouth 3 (three) times daily. One tablet TID with food prn pain. Patient not taking: Reported on 04/27/2015 11/18/14   Hayden Rasmussen, NP   BP 142/94 mmHg  Pulse 87  Temp(Src) 98.6 F (37 C) (Oral)  Resp 16  SpO2 100%  LMP 01/23/2015 Physical Exam  Constitutional: She is oriented to person, place, and time. She appears well-developed and well-nourished. No distress.  HENT:  Head: Normocephalic and atraumatic.  Eyes: Conjunctivae and EOM are normal.  Neck: Normal range of motion.  Cardiovascular: Normal rate and regular rhythm.  Exam reveals no gallop and no friction rub.   No murmur heard. Pulmonary/Chest: Effort normal and breath sounds normal. She has no wheezes. She has no rales. She exhibits no tenderness.  Abdominal: Soft. She exhibits no distension. There is no tenderness. There is no rebound.  Genitourinary: Vagina normal.  Moderate amount of thick, white vaginal discharge. No CMT. No tenderness to palpation on  bimanual exam.   Musculoskeletal: Normal range of motion.  Neurological: She is alert and oriented to person, place, and time. Coordination normal.  Speech is goal-oriented. Moves limbs without ataxia.   Skin: Skin is warm and dry.  Psychiatric: She has a normal mood and affect. Her behavior is normal.  Nursing note and vitals reviewed.   ED Course  Procedures (including critical care time) Labs Review Labs Reviewed  WET PREP, GENITAL - Abnormal; Notable for the following:    Yeast Wet Prep HPF POC FEW  (*)    WBC, Wet Prep HPF POC FEW (*)    All other components within normal limits  URINALYSIS, ROUTINE W REFLEX MICROSCOPIC (NOT AT [facility]) - Abnormal; Notable for the following:    APPearance TURBID (*)    Hgb urine dipstick LARGE (*)    Protein, ur 30 (*)    Leukocytes, UA MODERATE (*)    All other components within normal limits  URINE MICROSCOPIC-ADD ON - Abnormal; Notable for the following:    Squamous Epithelial / LPF MANY (*)    Bacteria, UA FEW (*)    All other components within normal limits  PREGNANCY, URINE  HIV ANTIBODY (ROUTINE TESTING)  POC URINE PREG, ED  GC/CHLAMYDIA PROBE AMP ([place]) NOT AT [facility]    Imaging Review No results found. I have personally reviewed and evaluated these images and lab results as part of my medical decision-making.   EKG Interpretation None      MDM   Final diagnoses:  Yeast infection of the vagina    1:07 AM Wet prep and GC/Chlamydia pending. Urine is contaminated. Vitals stable and patient afebrile.   1:46 AM Patient's wet prep shows yeast. Patient will also be treated for gonorrhea/chlamydia here. Patient instructed to practice safe sex. No further evaluation needed at this time.   Emilia Beck, PA-C 04/27/15 0147  Paula Libra, MD 04/27/15 [phone]

## 2015-04-27 NOTE — Discharge Instructions (Signed)
You have been treated for gonorrhea and chlamydia. You have also been treated for vaginal yeast infection. Refer to attached documents for more information. Use condoms and practice safe sex.

## 2015-06-21 ENCOUNTER — Emergency Department (HOSPITAL_COMMUNITY)
Admission: EM | Admit: 2015-06-21 | Discharge: 2015-06-22 | Disposition: A | Discharge status: Home / Self Care | Payer: Medicaid Other | Attending: Emergency Medicine | Admitting: Emergency Medicine

## 2015-06-21 ENCOUNTER — Encounter (HOSPITAL_COMMUNITY): Payer: Self-pay | Admitting: Emergency Medicine

## 2015-06-21 DIAGNOSIS — Z3202 Encounter for pregnancy test, result negative: Secondary | ICD-10-CM | POA: Diagnosis not present

## 2015-06-21 DIAGNOSIS — Z91040 Latex allergy status: Secondary | ICD-10-CM | POA: Insufficient documentation

## 2015-06-21 DIAGNOSIS — K2970 Gastritis, unspecified, without bleeding: Secondary | ICD-10-CM | POA: Diagnosis not present

## 2015-06-21 DIAGNOSIS — Z8742 Personal history of other diseases of the female genital tract: Secondary | ICD-10-CM | POA: Insufficient documentation

## 2015-06-21 DIAGNOSIS — R1084 Generalized abdominal pain: Secondary | ICD-10-CM | POA: Diagnosis present

## 2015-06-21 DIAGNOSIS — F17210 Nicotine dependence, cigarettes, uncomplicated: Secondary | ICD-10-CM | POA: Diagnosis not present

## 2015-06-21 DIAGNOSIS — Z8619 Personal history of other infectious and parasitic diseases: Secondary | ICD-10-CM | POA: Insufficient documentation

## 2015-06-21 LAB — URINALYSIS, ROUTINE W REFLEX MICROSCOPIC
Bilirubin Urine: NEGATIVE
Glucose, UA: NEGATIVE mg/dL
Hgb urine dipstick: NEGATIVE
Ketones, ur: NEGATIVE mg/dL
Nitrite: NEGATIVE
PH: 7.5 (ref 5.0–8.0)
Protein, ur: NEGATIVE mg/dL
SPECIFIC GRAVITY, URINE: 1.012 (ref 1.005–1.030)
UROBILINOGEN UA: 0.2 mg/dL (ref 0.0–1.0)

## 2015-06-21 LAB — CBC WITH DIFFERENTIAL/PLATELET
BASOS PCT: 0 %
Basophils Absolute: 0 10*3/uL (ref 0.0–0.1)
EOS PCT: 0 %
Eosinophils Absolute: 0 10*3/uL (ref 0.0–0.7)
HCT: 43.6 % (ref 36.0–46.0)
Hemoglobin: 15.1 g/dL — ABNORMAL HIGH (ref 12.0–15.0)
Lymphocytes Relative: 17 %
Lymphs Abs: 1.1 10*3/uL (ref 0.7–4.0)
MCH: 28.8 pg (ref 26.0–34.0)
MCHC: 34.6 g/dL (ref 30.0–36.0)
MCV: 83.2 fL (ref 78.0–100.0)
MONO ABS: 0.3 10*3/uL (ref 0.1–1.0)
Monocytes Relative: 5 %
Neutro Abs: 5 10*3/uL (ref 1.7–7.7)
Neutrophils Relative %: 78 %
Platelets: 365 10*3/uL (ref 150–400)
RBC: 5.24 MIL/uL — ABNORMAL HIGH (ref 3.87–5.11)
RDW: 13.3 % (ref 11.5–15.5)
WBC: 6.4 10*3/uL (ref 4.0–10.5)

## 2015-06-21 LAB — COMPREHENSIVE METABOLIC PANEL
ALBUMIN: 4.2 g/dL (ref 3.5–5.0)
ALT: 28 U/L (ref 14–54)
ANION GAP: 7 (ref 5–15)
AST: 24 U/L (ref 15–41)
Alkaline Phosphatase: 77 U/L (ref 38–126)
BUN: 8 mg/dL (ref 6–20)
CALCIUM: 9 mg/dL (ref 8.9–10.3)
CO2: 28 mmol/L (ref 22–32)
Chloride: 104 mmol/L (ref 101–111)
Creatinine, Ser: 0.61 mg/dL (ref 0.44–1.00)
GFR calc Af Amer: >60 mL/min (ref >60)
GFR calc non Af Amer: >60 mL/min (ref >60)
GLUCOSE: 101 mg/dL — AB (ref 65–99)
POTASSIUM: 3.9 mmol/L (ref 3.5–5.1)
Sodium: 139 mmol/L (ref 135–145)
Total Bilirubin: 1.2 mg/dL (ref 0.3–1.2)
Total Protein: 8.5 g/dL — ABNORMAL HIGH (ref 6.5–8.1)

## 2015-06-21 LAB — LIPASE, BLOOD: Lipase: 19 U/L (ref 11–51)

## 2015-06-21 LAB — URINE MICROSCOPIC-ADD ON

## 2015-06-21 LAB — I-STAT BETA HCG BLOOD, ED (MC, WL, AP ONLY)

## 2015-06-21 MED ORDER — FAMOTIDINE IN NACL 20-0.9 MG/50ML-% IV SOLN
20.0000 mg | Freq: Once | INTRAVENOUS | Status: AC
  Administered 2015-06-21: 20 mg via INTRAVENOUS
  Filled 2015-06-21: qty 50

## 2015-06-21 MED ORDER — ONDANSETRON 8 MG PO TBDP: 8.0000 mg | ORAL_TABLET | Freq: Three times a day (TID) | ORAL | Status: DC | PRN

## 2015-06-21 MED ORDER — SUCRALFATE 1 G PO TABS: 1.0000 g | ORAL_TABLET | Freq: Four times a day (QID) | ORAL | Status: DC

## 2015-06-21 MED ORDER — SODIUM CHLORIDE 0.9 % IV BOLUS (SEPSIS)
1000.0000 mL | Freq: Once | INTRAVENOUS | Status: AC
Start: 2015-06-21 — End: 2015-06-21
  Administered 2015-06-21: 1000 mL via INTRAVENOUS

## 2015-06-21 MED ORDER — FAMOTIDINE 20 MG PO TABS: 20.0000 mg | ORAL_TABLET | Freq: Two times a day (BID) | ORAL | Status: DC

## 2015-06-21 MED ORDER — SODIUM CHLORIDE 0.9 % IV BOLUS (SEPSIS): 1000.0000 mL | Freq: Once | INTRAVENOUS | Status: DC

## 2015-06-21 MED ORDER — ONDANSETRON HCL 4 MG/2ML IJ SOLN
4.0000 mg | Freq: Once | INTRAMUSCULAR | Status: AC
  Administered 2015-06-21: 4 mg via INTRAVENOUS
  Filled 2015-06-21: qty 2

## 2015-06-21 MED ORDER — MORPHINE SULFATE (PF) 4 MG/ML IV SOLN
4.0000 mg | Freq: Once | INTRAVENOUS | Status: AC
  Administered 2015-06-21: 4 mg via INTRAVENOUS
  Filled 2015-06-21: qty 1

## 2015-06-21 MED ORDER — SODIUM CHLORIDE 0.9 % IV SOLN: INTRAVENOUS | Status: DC

## 2015-06-21 NOTE — ED Provider Notes (Signed)
CSN: [id number]     Arrival date & time 06/21/15  1225 History   First MD Initiated Contact with Patient 06/21/15 1307     Chief Complaint  Patient presents with  . Abdominal Pain     (Consider location/radiation/quality/duration/timing/severity/associated sxs/prior Treatment) HPI Comments: Sx started after pt drank etoh last pm  Patient is a 29 y.o. female presenting with abdominal pain. The history is provided by the patient.  Abdominal Pain Pain location:  Generalized Pain quality: burning   Pain radiates to:  Does not radiate Pain severity:  Mild Onset quality:  Sudden Duration:  12 hours Timing:  Constant Progression:  Worsening Chronicity:  New Context: alcohol use   Relieved by:  Nothing Worsened by:  Nothing tried Ineffective treatments:  None tried Associated symptoms: nausea and vomiting   Associated symptoms: no diarrhea, no fever and no vaginal discharge     Past Medical History  Diagnosis Date  . Gonorrhea   . Chlamydia   . Bacterial vaginal infection   . Yeast vaginitis   . Abnormal Pap smear   . Pollen allergy    Past Surgical History  Procedure Laterality Date  . Cesarean section  2009    x 1   Family History  Problem Relation Age of Onset  . Asthma Mother   . Diabetes Father   . Hypertension Father    Social History  Substance Use Topics  . Smoking status: Current Every Day Smoker -- 0.50 packs/day for 3 years    Types: Cigarettes  . Smokeless tobacco: None  . Alcohol Use: Yes     Comment: Occasional   OB History    Gravida Para Term Preterm AB TAB SAB Ectopic Multiple Living   Review of Systems  Constitutional: Negative for fever.  Gastrointestinal: Positive for nausea, vomiting and abdominal pain. Negative for diarrhea.  Genitourinary: Negative for vaginal discharge.  All other systems reviewed and are negative.     Allergies  Latex  Home Medications   Prior to Admission medications   Not on File    BP 153/101 mmHg  Pulse 86  Temp(Src) 98.1 F (36.7 C) (Oral)  Resp 20  SpO2 95%  LMP 06/04/2015 (Approximate) Physical Exam  Constitutional: She is oriented to person, place, and time. She appears well-developed and well-nourished.  Non-toxic appearance. No distress.  HENT:  Head: Normocephalic and atraumatic.  Eyes: Conjunctivae, EOM and lids are normal. Pupils are equal, round, and reactive to light.  Neck: Normal range of motion. Neck supple. No tracheal deviation present. No thyroid mass present.  Cardiovascular: Normal rate, regular rhythm and normal heart sounds.  Exam reveals no gallop.   No murmur heard. Pulmonary/Chest: Effort normal and breath sounds normal. No stridor. No respiratory distress. She has no decreased breath sounds. She has no wheezes. She has no rhonchi. She has no rales.  Abdominal: Soft. Normal appearance and bowel sounds are normal. She exhibits no distension. There is generalized tenderness. There is no rebound and no CVA tenderness.  Musculoskeletal: Normal range of motion. She exhibits no edema or tenderness.  Neurological: She is alert and oriented to person, place, and time. She has normal strength. No cranial nerve deficit or sensory deficit. GCS eye subscore is 4. GCS verbal subscore is 5. GCS motor subscore is 6.  Skin: Skin is warm and dry. No abrasion and no rash noted.  Psychiatric: She has a normal mood and  affect. Her speech is normal and behavior is normal.  Nursing note and vitals reviewed.   ED Course  Procedures (including critical care time) Labs Review Labs Reviewed  CBC WITH DIFFERENTIAL/PLATELET  COMPREHENSIVE METABOLIC PANEL  LIPASE, BLOOD  URINALYSIS, ROUTINE W REFLEX MICROSCOPIC (NOT AT [facility])  I-STAT BETA HCG BLOOD, ED ([facility], [facility], [facility] ONLY)    Imaging Review No results found. I have personally reviewed and evaluated these images and lab results as part of my medical decision-making.   EKG Interpretation None      MDM    Final diagnoses:  None    Patient treated for gastritis and feels better here. Stable for discharge    Lorre NickAnthony Dashanique Brownstein, MD 06/26/15 [phone]

## 2015-06-21 NOTE — Discharge Instructions (Signed)

## 2015-06-21 NOTE — ED Provider Notes (Signed)
CSN: [id number]     Arrival date & time 06/21/15  1225 History   First MD Initiated Contact with Patient 06/21/15 1307     Chief Complaint  Patient presents with  . Abdominal Pain     (Consider location/radiation/quality/duration/timing/severity/associated sxs/prior Treatment) HPI Comments: Patient here complaining of nonbilious vomiting and abdominal discomfort which began after she drank several shots of cognac. No hematemesis. Emesis was nonbilious. She has moderate epigastric pain without bloody stools. No fever or chills but denies any urinary symptoms symptoms have been persistent all night and nothing makes them better or worse. No treatment use prior to arrival.  The history is provided by the patient.    Past Medical History  Diagnosis Date  . Gonorrhea   . Chlamydia   . Bacterial vaginal infection   . Yeast vaginitis   . Abnormal Pap smear   . Pollen allergy    Past Surgical History  Procedure Laterality Date  . Cesarean section  2009    x 1   Family History  Problem Relation Age of Onset  . Asthma Mother   . Diabetes Father   . Hypertension Father    Social History  Substance Use Topics  . Smoking status: Current Every Day Smoker -- 0.50 packs/day for 3 years    Types: Cigarettes  . Smokeless tobacco: None  . Alcohol Use: Yes     Comment: Occasional   OB History    Gravida Para Term Preterm AB TAB SAB Ectopic Multiple Living   2 1 1  1  1   1      Review of Systems  All other systems reviewed and are negative.     Allergies  Latex  Home Medications   Prior to Admission medications   Not on File   BP 153/101 mmHg  Pulse 86  Temp(Src) 98.1 F (36.7 C) (Oral)  Resp 20  SpO2 95%  LMP 06/04/2015 (Approximate) Physical Exam  Constitutional: She is oriented to person, place, and time. She appears well-developed and well-nourished.  Non-toxic appearance. No distress.  HENT:  Head: Normocephalic and atraumatic.  Eyes: Conjunctivae, EOM and  lids are normal. Pupils are equal, round, and reactive to light.  Neck: Normal range of motion. Neck supple. No tracheal deviation present. No thyroid mass present.  Cardiovascular: Normal rate, regular rhythm and normal heart sounds.  Exam reveals no gallop.   No murmur heard. Pulmonary/Chest: Effort normal and breath sounds normal. No stridor. No respiratory distress. She has no decreased breath sounds. She has no wheezes. She has no rhonchi. She has no rales.  Abdominal: Soft. Normal appearance and bowel sounds are normal. She exhibits no distension. There is no tenderness. There is no rebound and no CVA tenderness.  Musculoskeletal: Normal range of motion. She exhibits no edema or tenderness.  Neurological: She is alert and oriented to person, place, and time. She has normal strength. No cranial nerve deficit or sensory deficit. GCS eye subscore is 4. GCS verbal subscore is 5. GCS motor subscore is 6.  Skin: Skin is warm and dry. No abrasion and no rash noted.  Psychiatric: She has a normal mood and affect. Her speech is normal and behavior is normal.  Nursing note and vitals reviewed.   ED Course  Procedures (including critical care time) Labs Review Labs Reviewed  CBC WITH DIFFERENTIAL/PLATELET  COMPREHENSIVE METABOLIC PANEL  LIPASE, BLOOD  URINALYSIS, ROUTINE W REFLEX MICROSCOPIC (NOT AT [facility])  I-STAT BETA HCG BLOOD, ED ([facility], [facility], [facility] ONLY)  Imaging Review No results found. I have personally reviewed and evaluated these images and lab results as part of my medical decision-making.   EKG Interpretation None      MDM   Final diagnoses:  None    Patient treated for likely alcoholic gastritis and feels better. He can take by mouth here. Stable for discharge    Lorre Nick, MD 06/21/15 1443

## 2015-06-21 NOTE — ED Notes (Signed)
Pt states that she has been vomiting with mid abd pain since last night.  Also states that her head hurts.  LMP end of last month.  Unsure of day.  Denies drug use however smells of marijuana.  Was drinking last night.

## 2016-01-14 ENCOUNTER — Other Ambulatory Visit: Payer: Self-pay | Admitting: Obstetrics & Gynecology

## 2016-05-08 ENCOUNTER — Inpatient Hospital Stay (HOSPITAL_COMMUNITY)
Admission: AD | Admit: 2016-05-08 | Discharge: 2016-05-08 | Disposition: A | Discharge status: Home / Self Care | Payer: Medicaid Other | Source: Ambulatory Visit | Attending: Family Medicine | Admitting: Family Medicine

## 2016-05-08 ENCOUNTER — Encounter (HOSPITAL_COMMUNITY): Payer: Self-pay | Admitting: *Deleted

## 2016-05-08 DIAGNOSIS — Z7982 Long term (current) use of aspirin: Secondary | ICD-10-CM | POA: Insufficient documentation

## 2016-05-08 DIAGNOSIS — A5901 Trichomonal vulvovaginitis: Secondary | ICD-10-CM | POA: Insufficient documentation

## 2016-05-08 DIAGNOSIS — F17210 Nicotine dependence, cigarettes, uncomplicated: Secondary | ICD-10-CM | POA: Diagnosis not present

## 2016-05-08 DIAGNOSIS — A599 Trichomoniasis, unspecified: Secondary | ICD-10-CM

## 2016-05-08 DIAGNOSIS — N898 Other specified noninflammatory disorders of vagina: Secondary | ICD-10-CM | POA: Diagnosis present

## 2016-05-08 LAB — WET PREP, GENITAL
Clue Cells Wet Prep HPF POC: NONE SEEN
Sperm: NONE SEEN
YEAST WET PREP: NONE SEEN

## 2016-05-08 LAB — POCT PREGNANCY, URINE: Preg Test, Ur: NEGATIVE

## 2016-05-08 MED ORDER — METRONIDAZOLE 500 MG PO TABS: 2000.0000 mg | ORAL_TABLET | Freq: Once | ORAL | 0 refills | Status: AC

## 2016-05-08 NOTE — MAU Note (Signed)
Vaginal discomfort and itching for 3 days. Some pink d/c but just finishing period.

## 2016-05-08 NOTE — Discharge Instructions (Signed)
Trichomoniasis Trichomoniasis is an infection caused by an organism called Trichomonas. The infection can affect both women and men. In women, the outer female genitalia and the vagina are affected. In men, the penis is mainly affected, but the prostate and other reproductive organs can also be involved. Trichomoniasis is a sexually transmitted infection (STI) and is most often passed to another person through sexual contact.  RISK FACTORS  Having unprotected sexual intercourse.  Having sexual intercourse with an infected partner. SIGNS AND SYMPTOMS  Symptoms of trichomoniasis in women include:  Abnormal gray-green frothy vaginal discharge.  Itching and irritation of the vagina.  Itching and irritation of the area outside the vagina. Symptoms of trichomoniasis in men include:   Penile discharge with or without pain.  Pain during urination. This results from inflammation of the urethra. DIAGNOSIS  Trichomoniasis may be found during a Pap test or physical exam. Your health care provider may use one of the following methods to help diagnose this infection:  Testing the pH of the vagina with a test tape.  Using a vaginal swab test that checks for the Trichomonas organism. A test is available that provides results within a few minutes.  Examining a urine sample.  Testing vaginal secretions. Your health care provider may test you for other STIs, including HIV. TREATMENT   You may be given medicine to fight the infection. Women should inform their health care provider if they could be or are pregnant. Some medicines used to treat the infection should not be taken during pregnancy.  Your health care provider may recommend over-the-counter medicines or creams to decrease itching or irritation.  Your sexual partner will need to be treated if infected.  Your health care provider may test you for infection again 3 months after treatment. HOME CARE INSTRUCTIONS   Take medicines only as  directed by your health care provider.  Take over-the-counter medicine for itching or irritation as directed by your health care provider.  Do not have sexual intercourse while you have the infection.  Women should not douche or wear tampons while they have the infection.  Discuss your infection with your partner. Your partner may have gotten the infection from you, or you may have gotten it from your partner.  Have your sex partner get examined and treated if necessary.  Practice safe, informed, and protected sex.  See your health care provider for other STI testing. SEEK MEDICAL CARE IF:   You still have symptoms after you finish your medicine.  You develop abdominal pain.  You have pain when you urinate.  You have bleeding after sexual intercourse.  You develop a rash.  Your medicine makes you sick or makes you throw up (vomit). MAKE SURE YOU:  Understand these instructions.  Will watch your condition.  Will get help right away if you are not doing well or get worse.   This information is not intended to replace advice given to you by your health care provider. Make sure you discuss any questions you have with your health care provider.   Document Released: 01/18/2001 Document Revised: 08/15/2014 Document Reviewed: 05/06/2013 Elsevier Interactive Patient Education 2016 [employer]  

## 2016-05-08 NOTE — MAU Provider Note (Signed)
History     CSN: [id number]  Arrival date and time: 05/08/16 0006   First Provider Initiated Contact with Patient 05/08/16 0043      Chief Complaint  Patient presents with  . Vaginal Discharge   Vaginal Discharge  The patient's primary symptoms include a genital odor, pelvic pain and vaginal discharge. The patient's pertinent negatives include no genital itching, missed menses or vaginal bleeding. This is a new problem. The current episode started in the past 7 days. The problem occurs constantly. The problem has been gradually worsening. The pain is mild. The problem affects both sides. She is not pregnant. Associated symptoms include abdominal pain. Pertinent negatives include no back pain, chills, constipation, diarrhea, dysuria, fever, flank pain, frequency, headaches, joint swelling, nausea, rash, urgency or vomiting. The vaginal discharge was milky, green and thick. There has been no bleeding. She has not been passing clots. She has not been passing tissue. Nothing aggravates the symptoms. She has tried nothing for the symptoms. She is sexually active with multiple partners. No, her partner does not have an STD. She uses nothing for contraception. Her menstrual history has been regular.    OB History    Gravida Para Term Preterm AB Living   2 1 1   1 1    SAB TAB Ectopic Multiple Live Births   1              Past Medical History:  Diagnosis Date  . Abnormal Pap smear   . Bacterial vaginal infection   . Chlamydia   . Gonorrhea   . Pollen allergy   . Yeast vaginitis     Past Surgical History:  Procedure Laterality Date  . CESAREAN SECTION  2009   x 1    Family History  Problem Relation Age of Onset  . Asthma Mother   . Diabetes Father   . Hypertension Father     Social History  Substance Use Topics  . Smoking status: Current Every Day Smoker    Packs/day: 0.50    Years: 3.00    Types: Cigarettes  . Smokeless tobacco: Never Used  . Alcohol use Yes   Comment: Occasional    Allergies:  Allergies  Allergen Reactions  . Latex     Causes yeast infection    Prescriptions Prior to Admission  Medication Sig Dispense Refill Last Dose  . acetaminophen (TYLENOL) 500 MG tablet Take 500 mg by mouth every 6 (six) hours as needed.   Past Month at Unknown time  . aspirin-acetaminophen-caffeine (EXCEDRIN MIGRAINE) 250-250-65 MG tablet Take by mouth every 6 (six) hours as needed for headache.   Past Week at Unknown time  . famotidine (PEPCID) 20 MG tablet Take 1 tablet (20 mg total) by mouth 2 (two) times daily. 30 tablet 0 More than a month at Unknown time  . ondansetron (ZOFRAN ODT) 8 MG disintegrating tablet Take 1 tablet (8 mg total) by mouth every 8 (eight) hours as needed for nausea or vomiting. 20 tablet 0   . sucralfate (CARAFATE) 1 G tablet Take 1 tablet (1 g total) by mouth 4 (four) times daily. 30 tablet 0     Review of Systems  Constitutional: Negative for chills and fever.  HENT: Negative for hearing loss.   Respiratory: Negative for cough and sputum production.   Cardiovascular: Negative for chest pain and palpitations.  Gastrointestinal: Positive for abdominal pain. Negative for constipation, diarrhea, nausea and vomiting.  Genitourinary: Positive for pelvic pain and vaginal discharge. Negative for  dysuria, flank pain, frequency, missed menses and urgency.  Musculoskeletal: Negative for back pain.  Skin: Negative for itching and rash.  Neurological: Negative for headaches.   Physical Exam   Blood pressure 144/85, pulse 89, temperature 97.6 F (36.4 C), resp. rate 20, height 5\' 4"  (1.626 m), weight 241 lb 6.4 oz (109.5 kg), last menstrual period 04/23/2016.  Physical Exam  Constitutional: She appears well-developed and well-nourished.  HENT:  Head: Normocephalic and atraumatic.  Cardiovascular: Normal rate and intact distal pulses.   Respiratory: Effort normal and breath sounds normal. No respiratory distress.  GI: Soft.  Bowel sounds are normal. She exhibits no distension. There is no tenderness. There is no rebound and no guarding.  Genitourinary: There is no rash on the right labia. There is no rash on the left labia. No erythema or tenderness in the vagina. No signs of injury around the vagina. Vaginal discharge found.  Genitourinary Comments: mild whitish green frothy discharge the vaginal vault    MAU Course  Procedures  MDM In MAU patient underwent vaginal and pelvic examination which revealed a mild discharge. Gonorrhea and chlamydia as well as wet prep were collected.  Wet prep revealed Trichomonas.  Assessment and Plan  #1: Trichomonas prescription sent for Flagyl 2 g 1. Advised patient to take on full stomach is can cause GI upset.  Ernestina Pennaicholas Schenk 05/08/2016, 12:44 AM

## 2016-05-08 NOTE — Progress Notes (Signed)
Dr Sundra AlandSchank in earlier to discuss test results and d/c plan. Written and verbal d/c instructions given and understanding voiced

## 2016-05-09 LAB — GC/CHLAMYDIA PROBE AMP (~~LOC~~) NOT AT ARMC
CHLAMYDIA, DNA PROBE: POSITIVE — AB
NEISSERIA GONORRHEA: NEGATIVE

## 2016-05-11 ENCOUNTER — Telehealth: Payer: Self-pay | Admitting: Obstetrics and Gynecology

## 2016-05-11 MED ORDER — AZITHROMYCIN 500 MG PO TABS: 1000.0000 mg | ORAL_TABLET | Freq: Every day | ORAL | 0 refills | Status: DC

## 2016-05-11 NOTE — Telephone Encounter (Signed)
Informed that nursing spoke with pt prescription was previously sent to pharmacy.

## 2016-05-11 NOTE — Telephone Encounter (Signed)
Pt with positive Chlamydia in MAU. Treated for trichomonas in MAU. 1g azithromycin sent to pharmacy. Awaiting call back.

## 2016-08-16 ENCOUNTER — Inpatient Hospital Stay (HOSPITAL_COMMUNITY)
Admission: AD | Admit: 2016-08-16 | Discharge: 2016-08-16 | Disposition: A | Discharge status: Home / Self Care | Payer: Medicaid Other | Source: Ambulatory Visit | Attending: Obstetrics & Gynecology | Admitting: Obstetrics & Gynecology

## 2016-08-16 ENCOUNTER — Encounter (HOSPITAL_COMMUNITY): Payer: Self-pay | Admitting: *Deleted

## 2016-08-16 DIAGNOSIS — B373 Candidiasis of vulva and vagina: Secondary | ICD-10-CM | POA: Diagnosis not present

## 2016-08-16 DIAGNOSIS — B3731 Acute candidiasis of vulva and vagina: Secondary | ICD-10-CM

## 2016-08-16 LAB — URINALYSIS, ROUTINE W REFLEX MICROSCOPIC
Bacteria, UA: NONE SEEN
Bilirubin Urine: NEGATIVE
GLUCOSE, UA: NEGATIVE mg/dL
Ketones, ur: NEGATIVE mg/dL
Nitrite: NEGATIVE
Protein, ur: NEGATIVE mg/dL
Specific Gravity, Urine: 1.008 (ref 1.005–1.030)
pH: 6 (ref 5.0–8.0)

## 2016-08-16 LAB — POCT PREGNANCY, URINE: PREG TEST UR: NEGATIVE

## 2016-08-16 LAB — WET PREP, GENITAL
CLUE CELLS WET PREP: NONE SEEN
Sperm: NONE SEEN
TRICH WET PREP: NONE SEEN
YEAST WET PREP: NONE SEEN

## 2016-08-16 MED ORDER — FLUCONAZOLE 150 MG PO TABS: 150.0000 mg | ORAL_TABLET | Freq: Once | ORAL | 0 refills | Status: DC

## 2016-08-16 MED ORDER — FLUCONAZOLE 150 MG PO TABS
150.0000 mg | ORAL_TABLET | Freq: Once | ORAL | 0 refills | Status: AC
Start: 2016-08-16 — End: 2016-08-16

## 2016-08-16 NOTE — MAU Provider Note (Signed)
History     CSN: [id number]  Arrival date and time: 08/16/16 1940   First Provider Initiated Contact with Patient 08/16/16 2019      No chief complaint on file.  Non-pregnant female here with vaginal irritation. She describes as itchy, both internal and external and duration 4 days. She reports a clear discharge with no odor. No new partners. No recent abx. She is sexually active. She does not use protection. She reports a recent changed in soap. She denies douching.      Pertinent Gynecological History: Menses: 07/11/16 Contraception: none Sexually transmitted diseases: past history: CT, GC, trich Last pap: unsure   Past Medical History:  Diagnosis Date  . Abnormal Pap smear   . Bacterial vaginal infection   . Chlamydia   . Gonorrhea   . Pollen allergy   . Yeast vaginitis     Past Surgical History:  Procedure Laterality Date  . CESAREAN SECTION  2009   x 1    Family History  Problem Relation Age of Onset  . Asthma Mother   . Diabetes Father   . Hypertension Father     Social History  Substance Use Topics  . Smoking status: Current Every Day Smoker    Packs/day: 0.50    Years: 3.00    Types: Cigarettes  . Smokeless tobacco: Never Used  . Alcohol use Yes     Comment: Occasional    Allergies:  Allergies  Allergen Reactions  . Latex     Causes yeast infection    Prescriptions Prior to Admission  Medication Sig Dispense Refill Last Dose  . acetaminophen (TYLENOL) 500 MG tablet Take 500-1,000 mg by mouth every 6 (six) hours as needed.    Past Month at Unknown time  . aspirin-acetaminophen-caffeine (EXCEDRIN MIGRAINE) 250-250-65 MG tablet Take 1-2 tablets by mouth every 6 (six) hours as needed for headache.    Past Month at Unknown time  . azithromycin (ZITHROMAX) 500 MG tablet Take 2 tablets (1,000 mg total) by mouth daily. (Patient not taking: Reported on 08/16/2016) 2 tablet 0 Completed Course at Unknown time    Review of Systems  Constitutional:  Negative.   Genitourinary: Positive for vaginal discharge and vaginal pain.   Physical Exam   Blood pressure 147/94, pulse 83, temperature 98.7 F (37.1 C), temperature source Oral, resp. rate 17, height 5\' 4"  (1.626 m), weight 111.6 kg (246 lb), last menstrual period 07/11/2016, SpO2 99 %.  Physical Exam  Nursing note and vitals reviewed. Constitutional: She is oriented to person, place, and time. She appears well-developed and well-nourished. No distress.  HENT:  Head: Normocephalic and atraumatic.  Neck: Normal range of motion.  Cardiovascular: Normal rate.   Respiratory: Effort normal.  Genitourinary:     Genitourinary Comments: External: No erythema, +thin linear excoriations Vagina: rugated, parous, thin white curdy discharge    Musculoskeletal: Normal range of motion.  Neurological: She is alert and oriented to person, place, and time.  Skin: Skin is warm and dry.  Psychiatric: She has a normal mood and affect.   Results for orders placed or performed during the hospital encounter of 08/16/16 (from the past 24 hour(s))  Urinalysis, Routine w reflex microscopic     Status: Abnormal   Collection Time: 08/16/16  8:04 PM  Result Value Ref Range   Color, Urine YELLOW YELLOW   APPearance CLEAR CLEAR   Specific Gravity, Urine 1.008 1.005 - 1.030   pH 6.0 5.0 - 8.0   Glucose, UA NEGATIVE NEGATIVE  mg/dL   Hgb urine dipstick MODERATE (A) NEGATIVE   Bilirubin Urine NEGATIVE NEGATIVE   Ketones, ur NEGATIVE NEGATIVE mg/dL   Protein, ur NEGATIVE NEGATIVE mg/dL   Nitrite NEGATIVE NEGATIVE   Leukocytes, UA TRACE (A) NEGATIVE   RBC / HPF 0-5 0 - 5 RBC/hpf   WBC, UA 0-5 0 - 5 WBC/hpf   Bacteria, UA NONE SEEN NONE SEEN   Squamous Epithelial / LPF 0-5 (A) NONE SEEN   Mucous PRESENT   Pregnancy, urine POC     Status: None   Collection Time: 08/16/16  8:10 PM  Result Value Ref Range   Preg Test, Ur NEGATIVE NEGATIVE  Wet prep, genital     Status: Abnormal   Collection Time:  08/16/16  8:33 PM  Result Value Ref Range   Yeast Wet Prep HPF POC NONE SEEN NONE SEEN   Trich, Wet Prep NONE SEEN NONE SEEN   Clue Cells Wet Prep HPF POC NONE SEEN NONE SEEN   WBC, Wet Prep HPF POC MODERATE (A) NONE SEEN   Sperm NONE SEEN     MAU Course  Procedures  MDM Labs ordered and reviewed. Will treat for YV based on clinical exam. Elevated BPs today-strongly recommend f/u with PCP. Stable for discharge home.  Assessment and Plan   1. Yeast vaginitis    Discharge home Tub soaks daily Follow up with PCP for elevated BPs   Allergies as of 08/16/2016      Reactions   Latex    Causes yeast infection      Medication List    STOP taking these medications   azithromycin 500 MG tablet Commonly known as:  ZITHROMAX     TAKE these medications   acetaminophen 500 MG tablet Commonly known as:  TYLENOL Take 500-1,000 mg by mouth every 6 (six) hours as needed.   aspirin-acetaminophen-caffeine 250-250-65 MG tablet Commonly known as:  EXCEDRIN MIGRAINE Take 1-2 tablets by mouth every 6 (six) hours as needed for headache.   fluconazole 150 MG tablet Commonly known as:  DIFLUCAN Take 1 tablet (150 mg total) by mouth once.      Jillian Rowland, CNM 08/16/2016, 8:21 PM

## 2016-08-16 NOTE — Discharge Instructions (Signed)

## 2016-08-16 NOTE — MAU Note (Signed)
Pt reports a lot of vaginal discomfort x 4 days, worsening today. Reports vaginal itching, burning and discharge.

## 2016-08-17 LAB — GC/CHLAMYDIA PROBE AMP (~~LOC~~) NOT AT ARMC
Chlamydia: POSITIVE — AB
Neisseria Gonorrhea: NEGATIVE

## 2016-08-18 ENCOUNTER — Telehealth: Payer: Self-pay | Admitting: Certified Nurse Midwife

## 2016-08-18 DIAGNOSIS — A749 Chlamydial infection, unspecified: Secondary | ICD-10-CM | POA: Insufficient documentation

## 2016-08-18 MED ORDER — AZITHROMYCIN 500 MG PO TABS: 1000.0000 mg | ORAL_TABLET | Freq: Once | ORAL | 0 refills | Status: AC

## 2016-08-18 NOTE — Telephone Encounter (Signed)
Pt notified of +CT and Rx sent to pharmacy. Notified pt that partner would need treatment as well. Verbalized understanding.

## 2016-10-31 ENCOUNTER — Encounter (HOSPITAL_COMMUNITY): Payer: Self-pay | Admitting: Emergency Medicine

## 2016-10-31 ENCOUNTER — Ambulatory Visit (HOSPITAL_COMMUNITY)
Admission: EM | Admit: 2016-10-31 | Discharge: 2016-10-31 | Disposition: A | Discharge status: Home / Self Care | Payer: Medicaid Other | Attending: Internal Medicine | Admitting: Internal Medicine

## 2016-10-31 DIAGNOSIS — J029 Acute pharyngitis, unspecified: Secondary | ICD-10-CM | POA: Diagnosis not present

## 2016-10-31 DIAGNOSIS — J069 Acute upper respiratory infection, unspecified: Secondary | ICD-10-CM | POA: Diagnosis not present

## 2016-10-31 DIAGNOSIS — H9203 Otalgia, bilateral: Secondary | ICD-10-CM | POA: Insufficient documentation

## 2016-10-31 DIAGNOSIS — R51 Headache: Secondary | ICD-10-CM | POA: Insufficient documentation

## 2016-10-31 DIAGNOSIS — B9789 Other viral agents as the cause of diseases classified elsewhere: Secondary | ICD-10-CM

## 2016-10-31 DIAGNOSIS — R05 Cough: Secondary | ICD-10-CM

## 2016-10-31 LAB — POCT RAPID STREP A: STREPTOCOCCUS, GROUP A SCREEN (DIRECT): NEGATIVE

## 2016-10-31 MED ORDER — BENZOCAINE-MENTHOL 10-2.1 MG MT LOZG: 1.0000 | LOZENGE | Freq: Four times a day (QID) | OROMUCOSAL | 0 refills | Status: DC | PRN

## 2016-10-31 MED ORDER — BENZONATATE 100 MG PO CAPS: 100.0000 mg | ORAL_CAPSULE | Freq: Three times a day (TID) | ORAL | 0 refills | Status: DC

## 2016-10-31 NOTE — Discharge Instructions (Signed)
°Emergency Department Resource Guide °1) Find a Doctor and Pay Out of Pocket °Although you won't have to find out who is covered by your insurance plan, it is a good idea to ask around and get recommendations. You will then need to call the office and see if the doctor you have chosen will accept you as a new patient and what types of options they offer for patients who are self-pay. Some doctors offer discounts or will set up payment plans for their patients who do not have insurance, but you will need to ask so you aren't surprised when you get to your appointment. ° °2) Contact Your Local Health Department °Not all health departments have doctors that can see patients for sick visits, but many do, so it is worth a call to see if yours does. If you don't know where your local health department is, you can check in your phone book. The CDC also has a tool to help you locate your state's health department, and many state websites also have listings of all of their local health departments. ° °3) Find a Walk-in Clinic °If your illness is not likely to be very severe or complicated, you may want to try a walk in clinic. These are popping up all over the country in pharmacies, drugstores, and shopping centers. They're usually staffed by nurse practitioners or physician assistants that have been trained to treat common illnesses and complaints. They're usually fairly quick and inexpensive. However, if you have serious medical issues or chronic medical problems, these are probably not your best option. ° °No Primary Care Doctor: °- Call [facility] at  832-8000 - they can help you locate a primary care doctor that  accepts your insurance, provides certain services, etc. °- Physician Referral Service- 1-800-533-3463 ° °Chronic Pain Problems: °Organization         Address  Phone   Notes  °[facility]  (336) 297-2271 Patients need to be referred by their primary care doctor.  ° °Medication  Assistance: °Organization         Address  Phone   Notes  °[place] [facility] [address] °[place], [place] [address] (336) 641-8030 --Must be a resident of [place] °-- Must have NO insurance coverage whatsoever (no Medicaid/ Medicare, etc.) °-- The pt. MUST have a primary care doctor that directs their care regularly and follows them in the community °  °[facility]  (866) 331-1348   °[employer]  (888) 892-1162   ° °Agencies that provide inexpensive medical care: °Organization         Address  Phone   Notes  °[facility]  (336) 832-8035   °[facility]    (336) 832-7272   °[facility] [address] °[place], [place] [address] (336) 832-4777   °[facility] [address], °[place] (336) 271-4999   °[facility]    (336) 373-0678   °[facility]    (336) 272-1050   °[facility] ° [address], [place] Phone:  (336) 832-4444, Fax:  (336) 832-4440 Hours of Operation:  9 am - 6 pm, M-F.  Also accepts Medicaid/Medicare and self-pay.  °[facility] ° [address],  Phone: (336) 832-3150, Fax: (336) 832-3151. Hours of Operation:  8:30 am - 5:30 pm, M-F.  Also accepts Medicaid and self-pay.  °[facility] [place] [address], [place] Phone: (336) 878-6027   °[facility] [address], [place], [place] (336)723-1848, Ext. 123 Mondays & Thursdays: 7-9 AM.  First 15 patients are seen on a first come, first serve basis. °  ° °Medicaid-accepting [place] Providers: ° °Organization         Address  Phone   Notes  °[facility] [address] [place] [address] (336) 641-2100 Also accepts self-pay patients.  °[facility] [address], [place] ° (336) 856-9996   °[facility] [address], [place]  (336) 288-8857   °[facility] [address] [place] [address], [place] (336) 299-7000   °Veita Bland [address], [place]  ° (336) 373-1557 Only accepts [place] Access Medicaid patients after they have their name applied to their card.  ° °Self-Pay (no insurance) in [place]: ° °Organization         Address  Phone   Notes  °Sickle Cell Patients, [facility] [address], [place] (336) 832-1970   °[facility] [address], [place] (336) 832-4400   °[facility] ° [address], [place] (336) 992-4800   °[facility]/Dr. Osei-Bonsu ° [address] [place] [address], [place] or [address], [place] (336) 841-8500 Phone number for both [place] and [place] locations is the same.  °[facility] [address], [place] (336) 299-0000   °[facility] [address] [place] [address], [place] or [address] (336) 852-7530 °(336) 878-2260   °[facility] [address], [place] (336) 350-1642, phone; (336) 294-5005, fax Sees patients 1st and 3rd Saturday of every month.  Must not qualify for public or private insurance (i.e. Medicaid, Medicare, [place] Health Choice, Veterans' Benefits) • Household income should be no more than 200% of the poverty level •The clinic cannot treat you if you are pregnant or think you are pregnant • Sexually transmitted diseases are not treated at the clinic.  ° ° °Dental Care: °Organization         Address  Phone  Notes  °[place] [facility] [address], [place] (336) 641-6152 Accepts children up to age 21 who are enrolled in Medicaid or [place] Health Choice; pregnant women with a Medicaid card; and children who have applied for Medicaid or [place] Health Choice, but were declined, whose parents can pay a reduced fee at time of service.  °[place]  [facility] [place]  [address], [place] (336) 641-7733 Accepts children up to age 21 who are enrolled in Medicaid or [place] Health Choice; pregnant women with a Medicaid card; and children who have applied for Medicaid or [place] Health Choice, but were declined, whose parents can pay a reduced fee at time of service.  °[facility] ° [address],  (336) 641-4533 Patients are seen by appointment only. Walk-ins are not accepted. [facility] will see patients 18 years of age and older. °Monday - Tuesday (8am-5pm) °Most Wednesdays (8:30-5pm) °$30 per visit, cash only  °[facility] ° [address], [place] (336) 641-4533 Patients are seen by appointment only. Walk-ins are not accepted. [facility] will see patients 18 years of age and older. °One   Wednesday Evening (Monthly: Volunteer Based).  $30 per visit, cash only  °[facility]  (919) 537-3737 for adults; Children under age 4, call Graduate Pediatric Dentistry at ([phone]) [phone]. Children aged 4-14, please call (919) [phone] to request a pediatric application. ° Dental services are provided in all areas of dental care including fillings, crowns and bridges, complete and partial dentures, implants, gum treatment, root canals, and extractions. Preventive care is also provided. Treatment is provided to both adults and children. °Patients are selected via a lottery and there is often a waiting list. °  °[facility] [address], °[place] ° (336) 763-8833 [email or web address] °  °[facility] [address], [place], [place] (336)723-1848, Ext. 123 Second and Fourth Thursday of each month, opens at 6:30 AM; Clinic ends at 9 AM.  Patients are seen on a first-come first-served basis, and a limited number are seen during each clinic.  ° °[facility] ° [address], [place], [place] (336) 723-7904    Eligibility Requirements °You must have lived in [place], [place], or [place] counties for at least the last three months. °  You cannot be eligible for state or federal sponsored healthcare insurance, including Veterans Administration, Medicaid, or Medicare. °  You generally cannot be eligible for healthcare insurance through your employer.  °  How to apply: °Eligibility screenings are held every Tuesday and Wednesday afternoon from 1:00 pm until 4:00 pm. You do not need an appointment for the interview!  °[facility] [address], [place], [place] 336-631-2330   °[facility]  336-342-8273   °[facility]  336-703-3100   °[facility]  336-570-6415   ° °Behavioral Health Resources in the Community: °Intensive Outpatient Programs °Organization         Address  Phone  Notes  °[facility] [address], [place], [place] 336-878-6098   °[facility] [address], [place], [place] 336-832-9800   °[facility] [address], [place], [place] ° 336-882-2125   °[facility] [address],  °[place], [place] 1-800-853-5163 or 336-641-4981   °Substance Abuse Resources °Organization         Address  Phone  Notes  °[facility]  336-882-2125   °[facility]  336-784-9470   °[facility]  336-285-9073   °[facility]  336-845-3988   °Residential & Outpatient Substance Abuse Program  1-800-659-3381   °Psychological Services °Organization         Address  Phone  Notes  °[facility]  336- 832-9600   °[facility]  336- 378-7881   °[facility] [address], [place] 1-800-853-5163 or 336-641-4981   ° °Mobile Crisis Teams °Organization         Address  Phone  Notes  °[facility], Mobile Crisis Care Unit  1-877-626-1772   °Assertive °[facility] ° [address]  [place], [place] 336-834-9664   °Sharon DeEsch [address] °[place] 336-554-5454   ° °Self-Help/Support Groups °Organization         Address  Phone             Notes  °[facility] - variety of support groups  336- 373-1402 Call for more information  °Narcotics Anonymous (NA), [facility] [address], °[place]  2 meetings at this location  ° °  Residential Treatment Programs °Organization         Address  Phone  Notes  °[facility] [address],    °[place]  1-866-801-8205   °[facility] ° [address], [place], [place] 704-293-8524   °[facility] [address], [place] 336-845-3988 Admissions: 8am-3pm M-F  °[facility] [address]    °[place], [place] 336-841-1104   °[facility] [address], [place], [place] 336-379-7146   °[facility] [address]  °[place], [place] 336-285-9073   °[facility] - Intensive Outpatient [address], [place], [place] 336-852-3033   °[facility] [address]  °[place], [place] 1-877-615-2722 or 336-784-9470   °[facility] [address] [place], [place] 336-227-7417 Accepts Medicaid  °[facility] [address]  °[place] 1-800-659-3381 Substance Abuse/Addiction Treatment  ° °[place] Behavioral Health Resources °Organization         Address  Phone  Notes  °[facility]  (888) 581-9988   °Julie Brannon, PhD [address], [place]   (336) 349-5553 or (336) 951-0000   °[facility]   [address] °[place], [place] (336) 349-4454   °[facility] [address], [place], [place] (336) 342-8316 Insurance/Medicaid/sponsorship through [facility]  °[facility] [address]                                    [place], [place] (336) 342-8316 Therapy/tele-psych/case    °[facility] [address]  ° [place],  (336) 349-2233    °Dr. Arfeen  (336) 349-4544   °[facility] [place]  [employer] [place] [facility] 1) [address], [place] °2) [address], [place] °3)  [address], [place] (336) 349-3220 °(336) 342-7768 ° °(336) 342-8140   °[place] [facility] (336) 342-1394 or (336) 342-3537 (After Hours)    ° ° °

## 2016-10-31 NOTE — ED Provider Notes (Signed)
CSN: [id number]     Arrival date & time 10/31/16  1749 History   First MD Initiated Contact with Patient 10/31/16 1920     Chief Complaint  Patient presents with  . URI   (Consider location/radiation/quality/duration/timing/severity/associated sxs/prior Treatment) HPI  Jillian Rowland is a 31 y.o. female presenting to UC with c/o bilateral ear pain, congestion, sore throat, cough, and headache for about 4 days. No other sick contacts. She has taken tylenol w/o relief.  Pt also requesting to have throat checked for STIs. No known exposure but she is concerned that could be the cause of her throat pain.    Past Medical History:  Diagnosis Date  . Abnormal Pap smear   . Bacterial vaginal infection   . Chlamydia   . Gonorrhea   . Pollen allergy   . Yeast vaginitis    Past Surgical History:  Procedure Laterality Date  . CESAREAN SECTION  2009   x 1   Family History  Problem Relation Age of Onset  . Asthma Mother   . Diabetes Father   . Hypertension Father    Social History  Substance Use Topics  . Smoking status: Current Every Day Smoker    Packs/day: 0.25    Years: 3.00    Types: Cigarettes  . Smokeless tobacco: Never Used  . Alcohol use Yes     Comment: Occasional   OB History    Gravida Para Term Preterm AB Living   2 1 1   1 1    SAB TAB Ectopic Multiple Live Births   1       1     Review of Systems  Constitutional: Negative for chills and fever.  HENT: Positive for congestion, ear pain and sore throat. Negative for trouble swallowing and voice change.   Respiratory: Positive for cough. Negative for shortness of breath.   Cardiovascular: Negative for chest pain and palpitations.  Gastrointestinal: Negative for abdominal pain, diarrhea, nausea and vomiting.  Musculoskeletal: Negative for arthralgias, back pain and myalgias.  Skin: Negative for rash.  Neurological: Positive for headaches. Negative for dizziness and light-headedness.    Allergies   Latex  Home Medications   Prior to Admission medications   Medication Sig Start Date End Date Taking? Authorizing Provider  aspirin-acetaminophen-caffeine (EXCEDRIN MIGRAINE) 6064528721 MG tablet Take 1-2 tablets by mouth every 6 (six) hours as needed for headache.    Yes Historical Provider, MD  acetaminophen (TYLENOL) 500 MG tablet Take 500-1,000 mg by mouth every 6 (six) hours as needed.     Historical Provider, MD  Benzocaine-Menthol (CEPACOL SORE THROAT) 10-2.1 MG LOZG Use as directed 1 lozenge in the mouth or throat 4 (four) times daily as needed. 10/31/16   Junius Finner, PA-C  benzonatate (TESSALON) 100 MG capsule Take 1 capsule (100 mg total) by mouth every 8 (eight) hours. 10/31/16   Junius Finner, PA-C   Meds Ordered and Administered this Visit  Medications - No data to display  BP (!) 144/97 (BP Location: Right Arm)   Pulse 91   Temp 98.9 F (37.2 C) (Oral)   LMP 08/25/2016 (Approximate)   SpO2 98%  No data found.   Physical Exam  Constitutional: She is oriented to person, place, and time. She appears well-developed and well-nourished. No distress.  HENT:  Head: Normocephalic and atraumatic.  Right Ear: Tympanic membrane normal.  Left Ear: Tympanic membrane normal.  Nose: Nose normal.  Mouth/Throat: Uvula is midline and mucous membranes are normal. Posterior oropharyngeal edema  and posterior oropharyngeal erythema present. No oropharyngeal exudate or tonsillar abscesses.  Eyes: EOM are normal.  Neck: Normal range of motion. Neck supple.  Cardiovascular: Normal rate and regular rhythm.   Pulmonary/Chest: Effort normal and breath sounds normal. No stridor. No respiratory distress. She has no wheezes. She has no rales.  Musculoskeletal: Normal range of motion.  Lymphadenopathy:    She has cervical adenopathy.  Neurological: She is alert and oriented to person, place, and time.  Skin: Skin is warm and dry. She is not diaphoretic.  Psychiatric: She has a normal mood and  affect. Her behavior is normal.  Nursing note and vitals reviewed.   Urgent Care Course     Procedures (including critical care time)  Labs Review Labs Reviewed  POCT RAPID STREP A  GC/CHLAMYDIA PROBE AMP () NOT AT [facility]    Imaging Review No results found.    MDM   1. Viral URI with cough   2. Pharyngitis, unspecified etiology    Pt c/o 4 days of URI symptoms including sore throat.  Rapid strep: Negative  Pt requested to have throat swabbed for STIs.  GC/chlamydia swab sent to lab.   Discussed with pt no evidence of bacterial infection. Advised pt antibiotics will not help with her ear pain. Encouraged sinus rinses and alternating acetaminophen and ibuprofen.    Encouraged f/u with PCP in 1 week if not improving, sooner if worsening.     Junius Finnerrin O'Malley, PA-C 10/31/16 2012

## 2016-10-31 NOTE — ED Triage Notes (Signed)
Pt complains of bilateral ear pain, sore throat, headache, cough and congestion for four days.

## 2016-11-01 LAB — GC/CHLAMYDIA PROBE AMP (~~LOC~~) NOT AT ARMC
Chlamydia: NEGATIVE
Neisseria Gonorrhea: NEGATIVE

## 2016-11-03 LAB — CULTURE, GROUP A STREP (THRC)

## 2016-12-02 ENCOUNTER — Other Ambulatory Visit: Payer: Self-pay | Admitting: Obstetrics and Gynecology

## 2017-03-06 ENCOUNTER — Inpatient Hospital Stay (HOSPITAL_COMMUNITY)
Admission: AD | Admit: 2017-03-06 | Discharge: 2017-03-06 | Disposition: A | Discharge status: Home / Self Care | Payer: Medicaid Other | Source: Ambulatory Visit | Attending: Obstetrics & Gynecology | Admitting: Obstetrics & Gynecology

## 2017-03-06 ENCOUNTER — Encounter (HOSPITAL_COMMUNITY): Payer: Self-pay | Admitting: *Deleted

## 2017-03-06 DIAGNOSIS — B9689 Other specified bacterial agents as the cause of diseases classified elsewhere: Secondary | ICD-10-CM | POA: Insufficient documentation

## 2017-03-06 DIAGNOSIS — F17210 Nicotine dependence, cigarettes, uncomplicated: Secondary | ICD-10-CM | POA: Diagnosis not present

## 2017-03-06 DIAGNOSIS — B379 Candidiasis, unspecified: Secondary | ICD-10-CM

## 2017-03-06 DIAGNOSIS — Z91040 Latex allergy status: Secondary | ICD-10-CM | POA: Diagnosis not present

## 2017-03-06 DIAGNOSIS — Z9889 Other specified postprocedural states: Secondary | ICD-10-CM | POA: Diagnosis not present

## 2017-03-06 DIAGNOSIS — B373 Candidiasis of vulva and vagina: Secondary | ICD-10-CM

## 2017-03-06 DIAGNOSIS — I10 Essential (primary) hypertension: Secondary | ICD-10-CM | POA: Diagnosis not present

## 2017-03-06 DIAGNOSIS — Z833 Family history of diabetes mellitus: Secondary | ICD-10-CM | POA: Insufficient documentation

## 2017-03-06 DIAGNOSIS — Z825 Family history of asthma and other chronic lower respiratory diseases: Secondary | ICD-10-CM | POA: Diagnosis not present

## 2017-03-06 DIAGNOSIS — Z3202 Encounter for pregnancy test, result negative: Secondary | ICD-10-CM | POA: Diagnosis not present

## 2017-03-06 DIAGNOSIS — Z7982 Long term (current) use of aspirin: Secondary | ICD-10-CM | POA: Diagnosis not present

## 2017-03-06 DIAGNOSIS — Z8619 Personal history of other infectious and parasitic diseases: Secondary | ICD-10-CM | POA: Insufficient documentation

## 2017-03-06 DIAGNOSIS — R102 Pelvic and perineal pain: Secondary | ICD-10-CM | POA: Diagnosis present

## 2017-03-06 DIAGNOSIS — Z8249 Family history of ischemic heart disease and other diseases of the circulatory system: Secondary | ICD-10-CM | POA: Diagnosis not present

## 2017-03-06 HISTORY — DX: Essential (primary) hypertension: I10

## 2017-03-06 LAB — WET PREP, GENITAL
Clue Cells Wet Prep HPF POC: NONE SEEN
Sperm: NONE SEEN
Trich, Wet Prep: NONE SEEN

## 2017-03-06 LAB — URINALYSIS, ROUTINE W REFLEX MICROSCOPIC
Bilirubin Urine: NEGATIVE
GLUCOSE, UA: NEGATIVE mg/dL
KETONES UR: NEGATIVE mg/dL
Nitrite: NEGATIVE
PROTEIN: NEGATIVE mg/dL
Specific Gravity, Urine: 1.006 (ref 1.005–1.030)
pH: 6 (ref 5.0–8.0)

## 2017-03-06 LAB — POCT PREGNANCY, URINE: Preg Test, Ur: NEGATIVE

## 2017-03-06 MED ORDER — VALTREX 1 G PO TABS: 1000.0000 mg | ORAL_TABLET | Freq: Two times a day (BID) | ORAL | 3 refills | Status: DC

## 2017-03-06 MED ORDER — FLUCONAZOLE 150 MG PO TABS: 150.0000 mg | ORAL_TABLET | Freq: Once | ORAL | 1 refills | Status: AC

## 2017-03-06 NOTE — Discharge Instructions (Signed)

## 2017-03-06 NOTE — MAU Note (Signed)
Pt presents with vaginal pain for past 4 days that she describes as itching and burning, especially when she urinates.  States "some of the skin is missing" around her vaginal area.  White discharge with foul odor.

## 2017-03-06 NOTE — MAU Provider Note (Signed)
History     Jillian Rowland, 31 yo G2P1011 presenting with  Vaginal pain and itching and burning. The itching and burning began on Friday. Last night she felt small blisters but she subsequently scratched them off in the shower.  CSN: [id number]  Arrival date and time: 03/06/17 1827   None     Chief Complaint  Patient presents with  . Vaginal Pain   History of Present Illness  Patient Identification Jillian Rowland is a 31 y.o. female.  Patient information was obtained from patient. History/Exam limitations: none. Patient presented to the Emergency Department by private vehicle.  Chief Complaint  Vaginal Pain   The patient complains vaginal symptoms of burning and itching, vaginal discharge white, vaginal bleeding described as bleeding from the scabs of vulvar area from the intense scratching,and abdominal pain located in the vaginal described as burning and also crampy abdominal pain without radiation. Onset of symptoms was gradual starting 2 days ago. Severity of symptoms now is severe. Symptoms occur all the time Symptoms have been constant. Symptoms are aggravated by "everything", alleviated by nothing and are associated with dysuria. Other modifying factors include none.  She has not taken any recent ABX. She has not felt this intense of pain before and states with "feels like a bad yeast infection."  Scheduled Meds: Continuous Infusions: PRN Meds:   -- Latex    --  Causes yeast infection Social History   Marital status: Single              Spouse name:                      Years of education:                 Number of children:             Occupational History   None on file  Social History Main Topics   Smoking status: Current Every Day Smoker                                                    Packs/day: 0.25      Years: 3.00          Types: Cigarettes   Smokeless tobacco: Never Used                       Alcohol use: Yes               Comment: Occasional   Drug  use: No                Comment: none   Sexual activity: Yes                Other Topics            Concern   None on file  Social History Narrative   None on file       OB History    Gravida Para Term Preterm AB Living   2 1 1   1 1    SAB TAB Ectopic Multiple Live Births   1       1      Past Medical History:  Diagnosis Date  . Abnormal Pap smear   . Bacterial vaginal infection   . Chlamydia   .  Gonorrhea   . Hypertension   . Pollen allergy   . Yeast vaginitis     Past Surgical History:  Procedure Laterality Date  . CESAREAN SECTION  2009   x 1    Family History  Problem Relation Age of Onset  . Asthma Mother   . Diabetes Father   . Hypertension Father     Social History  Substance Use Topics  . Smoking status: Current Every Day Smoker    Packs/day: 0.25    Years: 3.00    Types: Cigarettes  . Smokeless tobacco: Never Used  . Alcohol use Yes     Comment: Occasional    Allergies:  Allergies  Allergen Reactions  . Latex     Causes yeast infection    Prescriptions Prior to Admission  Medication Sig Dispense Refill Last Dose  . acetaminophen (TYLENOL) 500 MG tablet Take 500-1,000 mg by mouth every 6 (six) hours as needed.    Past Month at Unknown time  . aspirin-acetaminophen-caffeine (EXCEDRIN MIGRAINE) 250-250-65 MG tablet Take 1-2 tablets by mouth every 6 (six) hours as needed for headache.    10/31/2016 at Unknown time  . Benzocaine-Menthol (CEPACOL SORE THROAT) 10-2.1 MG LOZG Use as directed 1 lozenge in the mouth or throat 4 (four) times daily as needed. 15 each 0   . benzonatate (TESSALON) 100 MG capsule Take 1 capsule (100 mg total) by mouth every 8 (eight) hours. 21 capsule 0     Review of Systems Physical Exam   Blood pressure (!) 149/85, pulse 93, temperature 98.2 F (36.8 C), temperature source Oral, resp. rate 18, weight 112.5 kg (248 lb 0.6 oz), last menstrual period 10/31/2016.  Physical Exam  Constitutional: She appears  well-developed and well-nourished.  HENT:  Head: Normocephalic and atraumatic.  Eyes: Pupils are equal, round, and reactive to light.  Cardiovascular: Normal rate and regular rhythm.   Respiratory: Effort normal and breath sounds normal.  Genitourinary: There is tenderness on the right labia. There is no rash on the right labia. There is tenderness on the left labia. There is no rash on the left labia. Cervix exhibits discharge. Cervix exhibits no friability. There is erythema in the vagina. No signs of injury around the vagina.    MAU Course  Procedures  MDM -GC chlamydia, wet prep, HIV, HSV culture and HSV-2 blood work  -pregnancy test negative  Assessment and Plan   1. Yeast infection    2. Patient stable for discharge for RX for Valtrex prophylactic.  3. Rx for Diflucan given.  4. Patient knows that we will call her with GC Ct results and HSV results.   Sullivan LoneBrannon L Inman 03/06/2017, 7:09 PM   Luna KitchensKathryn Frederico Gerling CNM

## 2017-03-07 LAB — HIV ANTIBODY (ROUTINE TESTING W REFLEX): HIV SCREEN 4TH GENERATION: NONREACTIVE

## 2017-03-07 LAB — HSV 2 ANTIBODY, IGG: HSV 2 Glycoprotein G Ab, IgG: <0.91 index (ref 0.00–0.90)

## 2017-03-07 LAB — GC/CHLAMYDIA PROBE AMP (~~LOC~~) NOT AT ARMC
Chlamydia: NEGATIVE
NEISSERIA GONORRHEA: NEGATIVE

## 2017-03-08 LAB — HSV CULTURE AND TYPING

## 2017-10-24 ENCOUNTER — Inpatient Hospital Stay (HOSPITAL_COMMUNITY)
Admission: AD | Admit: 2017-10-24 | Discharge: 2017-10-24 | Disposition: A | Discharge status: Home / Self Care | Payer: Medicaid Other | Source: Ambulatory Visit | Attending: Family Medicine | Admitting: Family Medicine

## 2017-10-24 ENCOUNTER — Encounter (HOSPITAL_COMMUNITY): Payer: Self-pay | Admitting: *Deleted

## 2017-10-24 DIAGNOSIS — I10 Essential (primary) hypertension: Secondary | ICD-10-CM | POA: Insufficient documentation

## 2017-10-24 DIAGNOSIS — Z8619 Personal history of other infectious and parasitic diseases: Secondary | ICD-10-CM | POA: Diagnosis not present

## 2017-10-24 DIAGNOSIS — B9689 Other specified bacterial agents as the cause of diseases classified elsewhere: Secondary | ICD-10-CM | POA: Diagnosis not present

## 2017-10-24 DIAGNOSIS — Z9889 Other specified postprocedural states: Secondary | ICD-10-CM | POA: Diagnosis not present

## 2017-10-24 DIAGNOSIS — O9989 Other specified diseases and conditions complicating pregnancy, childbirth and the puerperium: Secondary | ICD-10-CM

## 2017-10-24 DIAGNOSIS — Z79899 Other long term (current) drug therapy: Secondary | ICD-10-CM | POA: Insufficient documentation

## 2017-10-24 DIAGNOSIS — R1030 Lower abdominal pain, unspecified: Secondary | ICD-10-CM | POA: Insufficient documentation

## 2017-10-24 DIAGNOSIS — Z7982 Long term (current) use of aspirin: Secondary | ICD-10-CM | POA: Diagnosis not present

## 2017-10-24 DIAGNOSIS — Z833 Family history of diabetes mellitus: Secondary | ICD-10-CM | POA: Insufficient documentation

## 2017-10-24 DIAGNOSIS — Z91040 Latex allergy status: Secondary | ICD-10-CM | POA: Diagnosis not present

## 2017-10-24 DIAGNOSIS — Z8249 Family history of ischemic heart disease and other diseases of the circulatory system: Secondary | ICD-10-CM | POA: Insufficient documentation

## 2017-10-24 DIAGNOSIS — F17210 Nicotine dependence, cigarettes, uncomplicated: Secondary | ICD-10-CM | POA: Diagnosis not present

## 2017-10-24 DIAGNOSIS — Z825 Family history of asthma and other chronic lower respiratory diseases: Secondary | ICD-10-CM | POA: Insufficient documentation

## 2017-10-24 DIAGNOSIS — N760 Acute vaginitis: Secondary | ICD-10-CM | POA: Diagnosis not present

## 2017-10-24 HISTORY — DX: Other specified bacterial agents as the cause of diseases classified elsewhere: B96.89

## 2017-10-24 HISTORY — DX: Acute vaginitis: N76.0

## 2017-10-24 LAB — URINALYSIS, ROUTINE W REFLEX MICROSCOPIC
Bilirubin Urine: NEGATIVE
GLUCOSE, UA: NEGATIVE mg/dL
HGB URINE DIPSTICK: NEGATIVE
KETONES UR: NEGATIVE mg/dL
LEUKOCYTES UA: NEGATIVE
Nitrite: NEGATIVE
PH: 7 (ref 5.0–8.0)
Protein, ur: NEGATIVE mg/dL
Specific Gravity, Urine: 1.008 (ref 1.005–1.030)

## 2017-10-24 LAB — WET PREP, GENITAL
SPERM: NONE SEEN
TRICH WET PREP: NONE SEEN
Yeast Wet Prep HPF POC: NONE SEEN

## 2017-10-24 LAB — POCT PREGNANCY, URINE: Preg Test, Ur: NEGATIVE

## 2017-10-24 MED ORDER — KETOROLAC TROMETHAMINE 60 MG/2ML IM SOLN
60.0000 mg | INTRAMUSCULAR | Status: DC
  Filled 2017-10-24: qty 2

## 2017-10-24 MED ORDER — FLUCONAZOLE 150 MG PO TABS: 150.0000 mg | ORAL_TABLET | Freq: Every day | ORAL | 1 refills | Status: DC

## 2017-10-24 MED ORDER — METRONIDAZOLE 500 MG PO TABS: 500.0000 mg | ORAL_TABLET | Freq: Two times a day (BID) | ORAL | 0 refills | Status: AC

## 2017-10-24 NOTE — MAU Note (Signed)
Pt reports really sharp pain in her lower abd for 3 days. Also reports a white vaginal discharge with odor and vaginal itching.

## 2017-10-24 NOTE — MAU Provider Note (Addendum)
History     CSN: [id number]  Arrival date and time: 10/24/17 1758   First Provider Initiated Contact with Patient 10/24/17 2017      Chief Complaint  Patient presents with  . Possible Pregnancy  . Abdominal Pain  . Vaginal Discharge  . Vaginal Itching   HPI  Ms.  Jillian Rowland is a 32 y.o. year old G76P1011 non-pregnant female who presents to MAU reporting lower abdominal pain for 3 days and vaginal discharge with itching and odor. She reports her LMP was 08/18/2017, but she has irregular menstrual cycles. She denies VB.    Past Medical History:  Diagnosis Date  . Abnormal Pap smear   . Bacterial vaginal infection   . Bacterial vaginitis 10/24/2017  . Chlamydia   . Gonorrhea   . Hypertension   . Pollen allergy   . Yeast vaginitis     Past Surgical History:  Procedure Laterality Date  . CESAREAN SECTION  2009   x 1    Family History  Problem Relation Age of Onset  . Asthma Mother   . Diabetes Father   . Hypertension Father     Social History   Tobacco Use  . Smoking status: Current Every Day Smoker    Packs/day: 0.25    Years: 3.00    Pack years: 0.75    Types: Cigarettes  . Smokeless tobacco: Never Used  Substance Use Topics  . Alcohol use: Yes    Comment: Occasional  . Drug use: No    Comment: none    Allergies:  Allergies  Allergen Reactions  . Latex     Causes yeast infection    Medications Prior to Admission  Medication Sig Dispense Refill Last Dose  . aspirin-acetaminophen-caffeine (EXCEDRIN MIGRAINE) 250-250-65 MG tablet Take 1-2 tablets by mouth every 6 (six) hours as needed for headache.    10/23/2017 at Unknown time  . acetaminophen (TYLENOL) 500 MG tablet Take 500-1,000 mg by mouth every 6 (six) hours as needed.    More than a month at Unknown time  . Benzocaine-Menthol (CEPACOL SORE THROAT) 10-2.1 MG LOZG Use as directed 1 lozenge in the mouth or throat 4 (four) times daily as needed. 15 each 0   . benzonatate (TESSALON) 100 MG  capsule Take 1 capsule (100 mg total) by mouth every 8 (eight) hours. 21 capsule 0   . VALTREX 1 g tablet Take 1 tablet (1,000 mg total) by mouth 2 (two) times daily. 14 tablet 3     Review of Systems  Constitutional: Negative.   HENT: Negative.   Eyes: Negative.   Respiratory: Negative.   Cardiovascular: Negative.   Gastrointestinal: Positive for abdominal pain (lower).  Endocrine: Negative.   Genitourinary: Positive for vaginal discharge (with odor and itching).  Musculoskeletal: Negative.   Skin: Positive for rash (around umbilicus; from belly piercing).  Allergic/Immunologic: Negative.   Neurological: Negative.   Hematological: Negative.   Psychiatric/Behavioral: Negative.    Physical Exam   Patient Vitals for the past 24 hrs:  BP Temp Temp src Pulse Resp SpO2 Height Weight  10/24/17 2039 137/84 - - 76 - - - -  10/24/17 1853 (!) 154/97 99.1 F (37.3 C) Oral 73 16 100 % 5\' 4"  (1.626 m) 245 lb (111.1 kg)   Physical Exam  Nursing note and vitals reviewed. Constitutional: She is oriented to person, place, and time. She appears well-developed and well-nourished.  HENT:  Head: Normocephalic and atraumatic.  Eyes: Pupils are equal, round, and reactive  to light.  Neck: Normal range of motion.  Cardiovascular: Normal rate, regular rhythm and normal heart sounds.  Respiratory: Effort normal and breath sounds normal.  GI: Soft. Bowel sounds are normal.  Genitourinary:  Genitourinary Comments: Uterus: nontender, SE: cervix is smooth, pink, no lesions, moderate amt of frothy, malodorous thin, white vaginal d/c -- WP, GC/CT done, closed/long/firm, no CMT or friability, no adnexal tenderness   Musculoskeletal: Normal range of motion.  Neurological: She is alert and oriented to person, place, and time.  Skin: Skin is warm and dry.  Psychiatric: She has a normal mood and affect. Her behavior is normal. Judgment and thought content normal.    MAU Course   Procedures  MDM CCUA UPT CBC w/Diff ABO/Rh Wet Prep GC/CT -- pending HIV -- pending Toradol 60 mg IM --   Results for orders placed or performed during the hospital encounter of 10/24/17 (from the past 24 hour(s))  Urinalysis, Routine w reflex microscopic     Status: Abnormal   Collection Time: 10/24/17  6:50 PM  Result Value Ref Range   Color, Urine STRAW (A) YELLOW   APPearance CLEAR CLEAR   Specific Gravity, Urine 1.008 1.005 - 1.030   pH 7.0 5.0 - 8.0   Glucose, UA NEGATIVE NEGATIVE mg/dL   Hgb urine dipstick NEGATIVE NEGATIVE   Bilirubin Urine NEGATIVE NEGATIVE   Ketones, ur NEGATIVE NEGATIVE mg/dL   Protein, ur NEGATIVE NEGATIVE mg/dL   Nitrite NEGATIVE NEGATIVE   Leukocytes, UA NEGATIVE NEGATIVE  Pregnancy, urine POC     Status: None   Collection Time: 10/24/17  7:18 PM  Result Value Ref Range   Preg Test, Ur NEGATIVE NEGATIVE  Wet prep, genital     Status: Abnormal   Collection Time: 10/24/17  8:30 PM  Result Value Ref Range   Yeast Wet Prep HPF POC NONE SEEN NONE SEEN   Trich, Wet Prep NONE SEEN NONE SEEN   Clue Cells Wet Prep HPF POC PRESENT (A) NONE SEEN   WBC, Wet Prep HPF POC FEW (A) NONE SEEN   Sperm NONE SEEN     Assessment and Plan  Bacterial vaginitis - Plan: Discharge patient - Rx for Flagyl 500 mg BID x 7 days - Rx for Diflucan 150 mg once with 3 RF -- sent per pt request "I get yeast infections with abx use" - Advised to take ibuprofen 600 mg every 6 hrs prn abdominal pain - Information provided on BV - Discharge home  - Patient verbalized an understanding of the plan of care and agrees.   Jillian Moraolitta Jeriel Vivanco, MSN, CNM 10/24/2017, 8:19 PM

## 2017-10-24 NOTE — Discharge Instructions (Signed)
In late 2019, the [facility] will be moving to the [place] campus. At that time, the MAU (Maternity Admissions Unit), where you are being seen today, will no longer take care of non-pregnant patients. We strongly encourage you to find a doctor's office before that time, so that you can be seen with any GYN concerns, like vaginal discharge, urinary tract infection, etc.. in a timely manner. ° °In order to make an office visit more convenient, the [facility] will be offering evening hours with same-day appointments, walk-in appointments and scheduled appointments available during this time. ° °[facility] Hours: °Monday - 8am - 7:30 pm with walk-in between 4pm- 7:30 pm °Tuesday - 8 am - 5 pm (starting 11/07/17 we will be open late and accepting walk-ins from 4pm - 7:30pm) °Wednesday - 8 am - 5 pm (starting 02/07/18 we will be open late and accepting walk-ins from 4pm - 7:30pm) °Thursday 8 am - 5 pm (starting 05/10/18 we will be open late and accepting walk-ins from 4pm - 7:30pm) °Friday 8 am - 5 pm ° °For an appointment please call the [facility] at [phone] ° °For urgent needs, [facility] is also available for management of urgent GYN complaints such as vaginal discharge or urinary tract infections. ° ° ° ° ° °

## 2017-10-25 LAB — GC/CHLAMYDIA PROBE AMP (~~LOC~~) NOT AT ARMC
Chlamydia: NEGATIVE
Neisseria Gonorrhea: NEGATIVE

## 2018-05-02 ENCOUNTER — Other Ambulatory Visit: Payer: Self-pay

## 2018-05-02 ENCOUNTER — Encounter (HOSPITAL_COMMUNITY): Payer: Self-pay

## 2018-05-02 ENCOUNTER — Emergency Department (HOSPITAL_COMMUNITY)
Admission: EM | Admit: 2018-05-02 | Discharge: 2018-05-03 | Disposition: A | Discharge status: Home / Self Care | Payer: Medicaid Other | Attending: Emergency Medicine | Admitting: Emergency Medicine

## 2018-05-02 DIAGNOSIS — R0602 Shortness of breath: Secondary | ICD-10-CM | POA: Insufficient documentation

## 2018-05-02 DIAGNOSIS — J029 Acute pharyngitis, unspecified: Secondary | ICD-10-CM | POA: Insufficient documentation

## 2018-05-02 DIAGNOSIS — R0981 Nasal congestion: Secondary | ICD-10-CM | POA: Insufficient documentation

## 2018-05-02 DIAGNOSIS — R509 Fever, unspecified: Secondary | ICD-10-CM | POA: Insufficient documentation

## 2018-05-02 DIAGNOSIS — Z5321 Procedure and treatment not carried out due to patient leaving prior to being seen by health care provider: Secondary | ICD-10-CM | POA: Insufficient documentation

## 2018-05-02 DIAGNOSIS — R0789 Other chest pain: Secondary | ICD-10-CM | POA: Insufficient documentation

## 2018-05-02 MED ORDER — ACETAMINOPHEN 325 MG PO TABS
650.0000 mg | ORAL_TABLET | Freq: Once | ORAL | Status: AC | PRN
  Administered 2018-05-02: 650 mg via ORAL
  Filled 2018-05-02: qty 2

## 2018-05-02 NOTE — ED Triage Notes (Addendum)
Pt reports a sore throat that started yesterday, but worsened today. Today she reports SOB, chest feels tight and sore, congestion, body aches, and fever. A&Ox4.

## 2018-05-02 NOTE — ED Triage Notes (Signed)
Called pt for triage x1 No response 

## 2018-05-03 ENCOUNTER — Emergency Department (HOSPITAL_COMMUNITY): Payer: Medicaid Other

## 2018-05-03 LAB — GROUP A STREP BY PCR: GROUP A STREP BY PCR: NOT DETECTED

## 2018-06-23 ENCOUNTER — Encounter (HOSPITAL_COMMUNITY): Payer: Self-pay | Admitting: *Deleted

## 2018-06-23 ENCOUNTER — Inpatient Hospital Stay (HOSPITAL_COMMUNITY)
Admission: AD | Admit: 2018-06-23 | Discharge: 2018-06-23 | Disposition: A | Discharge status: Home / Self Care | Payer: Medicaid Other | Source: Ambulatory Visit | Attending: Obstetrics & Gynecology | Admitting: Obstetrics & Gynecology

## 2018-06-23 DIAGNOSIS — N898 Other specified noninflammatory disorders of vagina: Secondary | ICD-10-CM

## 2018-06-23 DIAGNOSIS — Z113 Encounter for screening for infections with a predominantly sexual mode of transmission: Secondary | ICD-10-CM

## 2018-06-23 DIAGNOSIS — B9689 Other specified bacterial agents as the cause of diseases classified elsewhere: Secondary | ICD-10-CM

## 2018-06-23 DIAGNOSIS — F17210 Nicotine dependence, cigarettes, uncomplicated: Secondary | ICD-10-CM | POA: Insufficient documentation

## 2018-06-23 DIAGNOSIS — N760 Acute vaginitis: Secondary | ICD-10-CM | POA: Insufficient documentation

## 2018-06-23 DIAGNOSIS — Z7982 Long term (current) use of aspirin: Secondary | ICD-10-CM | POA: Insufficient documentation

## 2018-06-23 DIAGNOSIS — I10 Essential (primary) hypertension: Secondary | ICD-10-CM | POA: Insufficient documentation

## 2018-06-23 LAB — URINALYSIS, ROUTINE W REFLEX MICROSCOPIC
Bilirubin Urine: NEGATIVE
Glucose, UA: NEGATIVE mg/dL
Ketones, ur: NEGATIVE mg/dL
Leukocytes, UA: NEGATIVE
Nitrite: NEGATIVE
Protein, ur: NEGATIVE mg/dL
Specific Gravity, Urine: 1.012 (ref 1.005–1.030)
pH: 6 (ref 5.0–8.0)

## 2018-06-23 LAB — WET PREP, GENITAL
Sperm: NONE SEEN
Trich, Wet Prep: NONE SEEN
Yeast Wet Prep HPF POC: NONE SEEN

## 2018-06-23 LAB — POCT PREGNANCY, URINE: Preg Test, Ur: NEGATIVE

## 2018-06-23 MED ORDER — METRONIDAZOLE 500 MG PO TABS: 500.0000 mg | ORAL_TABLET | Freq: Two times a day (BID) | ORAL | 0 refills | Status: DC

## 2018-06-23 MED ORDER — FLUCONAZOLE 150 MG PO TABS: 150.0000 mg | ORAL_TABLET | Freq: Every day | ORAL | 1 refills | Status: DC

## 2018-06-23 NOTE — Discharge Instructions (Signed)

## 2018-06-23 NOTE — MAU Provider Note (Signed)
History     CSN: [id number]  Arrival date and time: 06/23/18 1123   First Provider Initiated Contact with Patient 06/23/18 1155      Chief Complaint  Patient presents with  . Vaginal Discharge   Jillian Rowland is a 32 y.o. G2P1 not currently pregnant who presents to MAU with complaints of vaginal discharge and irritation. She reports vaginal discharge and irritation has been occurring for the past 4-5 days. She describes the discharge as yellow thin discharge with odor. She reports vaginal area being irritated in addition to the discharge. She denies soreness or lesions that she has noticed. She denies vaginal bleeding at this time- reports hx of AUB. Reports recent change in partners 2 months ago and would like STD screening.    OB History    Gravida  2   Para  1   Term  1   Preterm      AB  1   Living  1     SAB  1   TAB      Ectopic      Multiple      Live Births  1           Past Medical History:  Diagnosis Date  . Abnormal Pap smear   . Bacterial vaginal infection   . Bacterial vaginitis 10/24/2017  . Chlamydia   . Gonorrhea   . Hypertension   . Pollen allergy   . Yeast vaginitis     Past Surgical History:  Procedure Laterality Date  . CESAREAN SECTION  2009   x 1    Family History  Problem Relation Age of Onset  . Asthma Mother   . Diabetes Father   . Hypertension Father     Social History   Tobacco Use  . Smoking status: Current Every Day Smoker    Packs/day: 0.25    Years: 3.00    Pack years: 0.75    Types: Cigarettes  . Smokeless tobacco: Never Used  Substance Use Topics  . Alcohol use: Yes    Comment: Occasional  . Drug use: No    Comment: none    Allergies:  Allergies  Allergen Reactions  . Latex     Causes yeast infection    Medications Prior to Admission  Medication Sig Dispense Refill Last Dose  . acetaminophen (TYLENOL) 500 MG tablet Take 500-1,000 mg by mouth every 6 (six) hours as needed.    More than  a month at Unknown time  . aspirin-acetaminophen-caffeine (EXCEDRIN MIGRAINE) 250-250-65 MG tablet Take 1-2 tablets by mouth every 6 (six) hours as needed for headache.    10/23/2017 at Unknown time  . fluconazole (DIFLUCAN) 150 MG tablet Take 1 tablet (150 mg total) by mouth daily. 1 tablet 1   . VALTREX 1 g tablet Take 1 tablet (1,000 mg total) by mouth 2 (two) times daily. 14 tablet 3     Review of Systems  Constitutional: Negative.   Respiratory: Negative.   Cardiovascular: Negative.   Gastrointestinal: Negative.   Genitourinary: Positive for menstrual problem and vaginal discharge. Negative for difficulty urinating, dysuria, flank pain, frequency, genital sores, pelvic pain, urgency and vaginal bleeding.       Vaginal irritation    Physical Exam   Blood pressure (!) 146/89, pulse 86, temperature 98.2 F (36.8 C), temperature source Oral, resp. rate 18, weight 110.3 kg, last menstrual period 04/20/2018, SpO2 100 %.  Physical Exam  Nursing note and vitals reviewed. Constitutional: She  is oriented to person, place, and time. She appears well-developed and well-nourished. No distress.  Cardiovascular: Normal rate, regular rhythm and normal heart sounds.  Respiratory: Effort normal and breath sounds normal. No respiratory distress. She has no wheezes.  GI: Soft. Bowel sounds are normal. She exhibits no distension. There is no tenderness. There is no rebound.  Genitourinary: There is bleeding in the vagina. Vaginal discharge found.  Genitourinary Comments: Pelvic examination: cervix pink, visually closed, moderate amount of yellow thin discharge with foul odor present upon insertion of speculum, scant dark red vaginal bleeding at cervical os, negative CMT, uterus non tender, no abnormalities of adnexa palpated.   Neurological: She is alert and oriented to person, place, and time.    MAU Course  Procedures  MDM Orders Placed This Encounter  Procedures  . Wet prep, genital  .  Urinalysis, Routine w reflex microscopic  . Pregnancy, urine POC   Results for orders placed or performed during the hospital encounter of 06/23/18 (from the past 48 hour(s))  Urinalysis, Routine w reflex microscopic     Status: Abnormal   Collection Time: 06/23/18 11:56 AM  Result Value Ref Range   Color, Urine AMBER (A) YELLOW   APPearance CLOUDY (A) CLEAR   Specific Gravity, Urine 1.012 1.005 - 1.030   pH 6.0 5.0 - 8.0   Glucose, UA NEGATIVE NEGATIVE mg/dL   Hgb urine dipstick LARGE (A) NEGATIVE   Bilirubin Urine NEGATIVE NEGATIVE   Ketones, ur NEGATIVE NEGATIVE mg/dL   Protein, ur NEGATIVE NEGATIVE mg/dL   Nitrite NEGATIVE NEGATIVE   Leukocytes, UA NEGATIVE NEGATIVE   RBC / HPF 0-5 0 - 5 RBC/hpf   WBC, UA 0-5 0 - 5 WBC/hpf   Bacteria, UA RARE (A) NONE SEEN   Squamous Epithelial / LPF 21-50 0 - 5   Mucus PRESENT     Comment: Performed at [facility], [address], [place], [place] [address]  Pregnancy, urine POC     Status: None   Collection Time: 06/23/18 11:59 AM  Result Value Ref Range   Preg Test, Ur NEGATIVE NEGATIVE    Comment:        THE SENSITIVITY OF THIS METHODOLOGY IS >24 mIU/mL   HIV Antibody (routine testing w rflx)     Status: None   Collection Time: 06/23/18 12:13 PM  Result Value Ref Range   HIV Screen 4th Generation wRfx Non Reactive Non Reactive    Comment: (NOTE) Performed At: [facility] [address] [place], [place] [id number] Jolene Schimke MD XB:[id number]   Wet prep, genital     Status: Abnormal   Collection Time: 06/23/18 12:39 PM  Result Value Ref Range   Yeast Wet Prep HPF POC NONE SEEN NONE SEEN   Trich, Wet Prep NONE SEEN NONE SEEN   Clue Cells Wet Prep HPF POC PRESENT (A) NONE SEEN   WBC, Wet Prep HPF POC FEW (A) NONE SEEN    Comment: FEW BACTERIA SEEN   Sperm NONE SEEN     Comment: Performed at [facility], [address] [place], [place] [address]   Wet prep- positive for clue cells, will treat for BV based  on clinical symptoms  STD screening pending   Discussed results with patient. Rx for Flagyl sent to pharmacy of choice, patient request diflucan for use after Flagyl. Discussed reasons to return to MAU. Will call patient with results of STD screening and manage accordingly. Pt stable at time of discharge.   Meds ordered this  encounter  Medications  . metroNIDAZOLE (FLAGYL) 500 MG tablet    Sig: Take 1 tablet (500 mg total) by mouth 2 (two) times daily.    Dispense:  14 tablet    Refill:  0    Order Specific Question:   Supervising Provider    Answer:   Adam PhenixARNOLD, JAMES G [[id number]]  . fluconazole (DIFLUCAN) 150 MG tablet    Sig: Take 1 tablet (150 mg total) by mouth daily.    Dispense:  1 tablet    Refill:  1    Order Specific Question:   Supervising Provider    Answer:   Adam PhenixARNOLD, JAMES G [[id number]]   Assessment and Plan   1. BV (bacterial vaginosis)   2. Vaginal irritation   3. Screening for STDs (sexually transmitted diseases)    Discharge home  Rx for flagyl and diflucan  Follow up with PCP or urgent care for worsening symptoms  Discussed reasons to return to MAU  STD screening pending   Allergies as of 06/23/2018      Reactions   Latex    Causes yeast infection      Medication List    TAKE these medications   acetaminophen 500 MG tablet Commonly known as:  TYLENOL Take 500-1,000 mg by mouth every 6 (six) hours as needed.   aspirin-acetaminophen-caffeine 250-250-65 MG tablet Commonly known as:  EXCEDRIN MIGRAINE Take 1-2 tablets by mouth every 6 (six) hours as needed for headache.   fluconazole 150 MG tablet Commonly known as:  DIFLUCAN Take 1 tablet (150 mg total) by mouth daily.   metroNIDAZOLE 500 MG tablet Commonly known as:  FLAGYL Take 1 tablet (500 mg total) by mouth 2 (two) times daily.   VALTREX 1000 MG tablet Generic drug:  valACYclovir Take 1 tablet (1,000 mg total) by mouth 2 (two) times daily.       Sharyon CableVeronica C Sanda Dejoy CNM  06/23/2018, 1:12 PM

## 2018-06-23 NOTE — MAU Note (Signed)
[name] is a 32 y.o. here in MAU reporting: +vaginal irritation and discharge. Started as pink in color but now yellow. LMP: 04/20/18. States normally has irregular periods. Onset of complaint: past 4 days. Pain score: denies Vitals:   06/23/18 1134  BP: (!) 146/89  Pulse: 86  Resp: 18  Temp: 98.2 F (36.8 C)  SpO2: 100%      Lab orders placed from triage: ua and pregnancy test.

## 2018-06-24 LAB — HIV ANTIBODY (ROUTINE TESTING W REFLEX): HIV Screen 4th Generation wRfx: NONREACTIVE

## 2018-06-24 LAB — RPR: RPR Ser Ql: NONREACTIVE

## 2018-06-25 LAB — GC/CHLAMYDIA PROBE AMP (~~LOC~~) NOT AT ARMC
Chlamydia: NEGATIVE
Neisseria Gonorrhea: NEGATIVE

## 2018-09-24 ENCOUNTER — Inpatient Hospital Stay (HOSPITAL_COMMUNITY)
Admission: AD | Admit: 2018-09-24 | Discharge: 2018-09-24 | Disposition: A | Discharge status: Home / Self Care | Payer: Medicaid Other | Attending: Obstetrics & Gynecology | Admitting: Obstetrics & Gynecology

## 2018-09-24 ENCOUNTER — Encounter (HOSPITAL_COMMUNITY): Payer: Self-pay

## 2018-09-24 DIAGNOSIS — F17210 Nicotine dependence, cigarettes, uncomplicated: Secondary | ICD-10-CM | POA: Insufficient documentation

## 2018-09-24 DIAGNOSIS — R102 Pelvic and perineal pain: Secondary | ICD-10-CM

## 2018-09-24 DIAGNOSIS — Z3202 Encounter for pregnancy test, result negative: Secondary | ICD-10-CM | POA: Diagnosis not present

## 2018-09-24 DIAGNOSIS — N898 Other specified noninflammatory disorders of vagina: Secondary | ICD-10-CM | POA: Diagnosis not present

## 2018-09-24 DIAGNOSIS — R109 Unspecified abdominal pain: Secondary | ICD-10-CM | POA: Diagnosis not present

## 2018-09-24 LAB — URINALYSIS, ROUTINE W REFLEX MICROSCOPIC
Bilirubin Urine: NEGATIVE
GLUCOSE, UA: NEGATIVE mg/dL
HGB URINE DIPSTICK: NEGATIVE
KETONES UR: NEGATIVE mg/dL
Nitrite: NEGATIVE
PH: 6 (ref 5.0–8.0)
PROTEIN: NEGATIVE mg/dL
Specific Gravity, Urine: 1.015 (ref 1.005–1.030)

## 2018-09-24 LAB — URINALYSIS, MICROSCOPIC (REFLEX): RBC / HPF: NONE SEEN RBC/hpf (ref 0–5)

## 2018-09-24 LAB — WET PREP, GENITAL
CLUE CELLS WET PREP: NONE SEEN
SPERM: NONE SEEN
Trich, Wet Prep: NONE SEEN
Yeast Wet Prep HPF POC: NONE SEEN

## 2018-09-24 LAB — POCT PREGNANCY, URINE: Preg Test, Ur: NEGATIVE

## 2018-09-24 NOTE — Discharge Instructions (Signed)
Vaginitis    Vaginitis is irritation and swelling (inflammation) of the vagina. It happens when normal bacteria and yeast in the vagina grow too much. There are many types of this condition. Treatment will depend on the type you have.  Follow these instructions at home:  Lifestyle  · Keep your vagina area clean and dry.  ? Avoid using soap.  ? Rinse the area with water.  · Do not do the following until your doctor says it is okay:  ? Wash and clean out the vagina (douche).  ? Use tampons.  ? Have sex.  · Wipe from front to back after going to the bathroom.  · Let air reach your vagina.  ? Wear cotton underwear.  ? Do not wear:  ? Underwear while you sleep.  ? Tight pants.  ? Thong underwear.  ? Underwear or nylons without a cotton panel.  ? Take off any wet clothing, such as bathing suits, as soon as possible.  · Use gentle, non-scented products. Do not use things that can irritate the vagina, such as fabric softeners. Avoid the following products if they are scented:  ? Feminine sprays.  ? Detergents.  ? Tampons.  ? Feminine hygiene products.  ? Soaps or bubble baths.  · Practice safe sex and use condoms.  General instructions  · Take over-the-counter and prescription medicines only as told by your doctor.  · If you were prescribed an antibiotic medicine, take or use it as told by your doctor. Do not stop taking or using the antibiotic even if you start to feel better.  · Keep all follow-up visits as told by your doctor. This is important.  Contact a doctor if:  · You have pain in your belly.  · You have a fever.  · Your symptoms last for more than 2-3 days.  Get help right away if:  · You have a fever and your symptoms get worse all of a sudden.  Summary  · Vaginitis is irritation and swelling of the vagina. It can happen when the normal bacteria and yeast in the vagina grow too much. There are many types.  · Treatment will depend on the type you have.  · Do not douche, use tampons , or have sex until your health  care provider approves. When you can return to sex, practice safe sex and use condoms.  This information is not intended to replace advice given to you by your health care provider. Make sure you discuss any questions you have with your health care provider.  Document Released: 10/21/2008 Document Revised: 08/16/2016 Document Reviewed: 08/16/2016  Elsevier Interactive Patient Education © 2019 [employer]

## 2018-09-24 NOTE — MAU Note (Signed)
Pt here for sharp, lower abdominal pain that started yesterday that has worsened through out the day. Pt has not tried anything for pain. Rates pain 9/10. Pt reports vaginal irritation that started 4 days ago. Reports some thin, itchy yellow discharge. Denies odor or vaginal bleeding. LMP 07/09/2018. Has history or irregular periods.

## 2018-09-24 NOTE — MAU Provider Note (Signed)
History     CSN: [id number]  Arrival date and time: 09/24/18 1910   First Provider Initiated Contact with Patient 09/24/18 2043      Chief Complaint  Patient presents with  . Abdominal Pain  . Vaginal Itching   HPI Jillian Rowland is a 33 y.o. G2P1011 non-pregnant patient who presents to MAU with chief complaint of vaginal irritation and abdominal cramping. These are new problems, onset four days ago. Patient endorses unprotected sexual intercourse with new partner just before onset of symptoms. She denies abnormal vaginal discharge, new hygiene products of irritants, vaginal bleeding, urinary symptoms, fever or recent illness.  Pertinent Gynecological History: Menses: flow is moderate Contraception: irregular condom use Sexually transmitted diseases: past history: Chlamydia Last pap: normal Date: 12/31/2014   Past Medical History:  Diagnosis Date  . Abnormal Pap smear   . Bacterial vaginal infection   . Bacterial vaginitis 10/24/2017  . Chlamydia   . Gonorrhea   . Hypertension   . Pollen allergy   . Yeast vaginitis     Past Surgical History:  Procedure Laterality Date  . CESAREAN SECTION  2009   x 1    Family History  Problem Relation Age of Onset  . Asthma Mother   . Diabetes Father   . Hypertension Father     Social History   Tobacco Use  . Smoking status: Current Every Day Smoker    Packs/day: 0.25    Years: 3.00    Pack years: 0.75    Types: Cigarettes  . Smokeless tobacco: Never Used  Substance Use Topics  . Alcohol use: Yes    Comment: Occasional  . Drug use: No    Comment: none    Allergies:  Allergies  Allergen Reactions  . Latex     Causes yeast infection    Medications Prior to Admission  Medication Sig Dispense Refill Last Dose  . acetaminophen (TYLENOL) 500 MG tablet Take 500-1,000 mg by mouth every 6 (six) hours as needed.    More than a month at Unknown time  . aspirin-acetaminophen-caffeine (EXCEDRIN MIGRAINE) 250-250-65 MG  tablet Take 1-2 tablets by mouth every 6 (six) hours as needed for headache.    10/23/2017 at Unknown time  . fluconazole (DIFLUCAN) 150 MG tablet Take 1 tablet (150 mg total) by mouth daily. 1 tablet 1   . metroNIDAZOLE (FLAGYL) 500 MG tablet Take 1 tablet (500 mg total) by mouth 2 (two) times daily. 14 tablet 0   . VALTREX 1 g tablet Take 1 tablet (1,000 mg total) by mouth 2 (two) times daily. 14 tablet 3     Review of Systems  Constitutional: Negative for chills and fever.  Gastrointestinal: Positive for abdominal pain.  Genitourinary: Positive for vaginal pain. Negative for difficulty urinating, dyspareunia, dysuria, genital sores, hematuria, menstrual problem, pelvic pain, vaginal bleeding and vaginal discharge.  Musculoskeletal: Negative for back pain.  All other systems reviewed and are negative.  Physical Exam   Blood pressure (!) 137/92, pulse 76, temperature 98.2 F (36.8 C), temperature source Oral, resp. rate 17, height 5\' 5"  (1.651 m), weight 113.9 kg, last menstrual period 07/09/2018, SpO2 99 %.  Physical Exam  Nursing note and vitals reviewed. Constitutional: She is oriented to person, place, and time. She appears well-developed and well-nourished.  Cardiovascular: Normal rate.  Respiratory: Effort normal. No respiratory distress.  GI: Soft. She exhibits no distension. There is no abdominal tenderness. There is no rigidity, no rebound, no guarding and no CVA tenderness.  Genitourinary:    Vagina and uterus normal.     No vaginal discharge.     Genitourinary Comments: Thin white vaginal discharge throughout vaginal vault. Not foul smelling, no aggregation. Negative CMT   Neurological: She is alert and oriented to person, place, and time.  Skin: Skin is warm and dry.  Psychiatric: She has a normal mood and affect. Her behavior is normal. Judgment and thought content normal.    MAU Course/MDM  ProceduresL: sterile speculum exam  Patient Vitals for the past 24 hrs:  BP  Temp Temp src Pulse Resp SpO2 Height Weight  09/24/18 2118 (!) 142/96 - - - - - - -  09/24/18 1944 (!) 137/92 98.2 F (36.8 C) Oral 76 17 99 % 5\' 5"  (1.651 m) 113.9 kg    Results for orders placed or performed during the hospital encounter of 09/24/18 (from the past 24 hour(s))  Urinalysis, Routine w reflex microscopic     Status: Abnormal   Collection Time: 09/24/18  7:54 PM  Result Value Ref Range   Color, Urine YELLOW YELLOW   APPearance CLEAR CLEAR   Specific Gravity, Urine 1.015 1.005 - 1.030   pH 6.0 5.0 - 8.0   Glucose, UA NEGATIVE NEGATIVE mg/dL   Hgb urine dipstick NEGATIVE NEGATIVE   Bilirubin Urine NEGATIVE NEGATIVE   Ketones, ur NEGATIVE NEGATIVE mg/dL   Protein, ur NEGATIVE NEGATIVE mg/dL   Nitrite NEGATIVE NEGATIVE   Leukocytes,Ua SMALL (A) NEGATIVE  Urinalysis, Microscopic (reflex)     Status: Abnormal   Collection Time: 09/24/18  7:54 PM  Result Value Ref Range   RBC / HPF NONE SEEN 0 - 5 RBC/hpf   WBC, UA 0-5 0 - 5 WBC/hpf   Bacteria, UA RARE (A) NONE SEEN   Squamous Epithelial / LPF 0-5 0 - 5  Pregnancy, urine POC     Status: None   Collection Time: 09/24/18  7:58 PM  Result Value Ref Range   Preg Test, Ur NEGATIVE NEGATIVE  Wet prep, genital     Status: Abnormal   Collection Time: 09/24/18  8:51 PM  Result Value Ref Range   Yeast Wet Prep HPF POC NONE SEEN NONE SEEN   Trich, Wet Prep NONE SEEN NONE SEEN   Clue Cells Wet Prep HPF POC NONE SEEN NONE SEEN   WBC, Wet Prep HPF POC MODERATE (A) NONE SEEN   Sperm NONE SEEN      Assessment and Plan  --33 y.o. [id number]  --Negative pregnancy test --Abnormal vaginal discharge --GC/Chlamydia pending --Discharge home in stable condition   Calvert Cantor, CNM 09/24/2018, 10:33 PM

## 2018-09-25 LAB — GC/CHLAMYDIA PROBE AMP (~~LOC~~) NOT AT ARMC
Chlamydia: NEGATIVE
Neisseria Gonorrhea: NEGATIVE

## 2019-07-17 ENCOUNTER — Ambulatory Visit (HOSPITAL_COMMUNITY)
Admission: EM | Admit: 2019-07-17 | Discharge: 2019-07-17 | Disposition: A | Discharge status: Home / Self Care | Payer: Medicaid Other | Attending: Family Medicine | Admitting: Family Medicine

## 2019-07-17 ENCOUNTER — Other Ambulatory Visit: Payer: Self-pay

## 2019-07-17 ENCOUNTER — Encounter (HOSPITAL_COMMUNITY): Payer: Self-pay | Admitting: Emergency Medicine

## 2019-07-17 DIAGNOSIS — Z20822 Contact with and (suspected) exposure to covid-19: Secondary | ICD-10-CM

## 2019-07-17 DIAGNOSIS — Z3202 Encounter for pregnancy test, result negative: Secondary | ICD-10-CM

## 2019-07-17 DIAGNOSIS — Z20828 Contact with and (suspected) exposure to other viral communicable diseases: Secondary | ICD-10-CM | POA: Insufficient documentation

## 2019-07-17 DIAGNOSIS — N898 Other specified noninflammatory disorders of vagina: Secondary | ICD-10-CM | POA: Diagnosis present

## 2019-07-17 LAB — POC URINE PREG, ED: Preg Test, Ur: NEGATIVE

## 2019-07-17 LAB — POCT PREGNANCY, URINE: Preg Test, Ur: NEGATIVE

## 2019-07-17 NOTE — ED Provider Notes (Signed)
Jillian Rowland    CSN: [id number] Arrival date & time: 07/17/19  1208      History   Chief Complaint Chief Complaint  Patient presents with  . URI    COVID exposure    HPI Jillian Rowland is a 33 y.o. female.   Jillian Rowland presents with requests for covid-19 testing after an exposure. Her friend tested positive after starting feeling unwell two days ago, she has been around this friend three days ago. She feels well currently. Also with complaints of vaginal discharge with some odor. No itching. She is sexually active with 2 partners, doesn't always use condoms. Some low back pain. No urinary symptoms. No pelvic pain. No vaginal bleeding. Requesting pregnancy test after her most recent period was lighter than usual for her. LMP 12/3. No specific known std exposure. Has had bv in the past which felt similar.     ROS per HPI, negative if not otherwise mentioned.      Past Medical History:  Diagnosis Date  . Abnormal Pap smear   . Bacterial vaginal infection   . Bacterial vaginitis 10/24/2017  . Chlamydia   . Gonorrhea   . Hypertension   . Pollen allergy   . Yeast vaginitis     Patient Active Problem List   Diagnosis Date Noted  . Bacterial vaginitis 10/24/2017  . Chlamydia 08/18/2016    Past Surgical History:  Procedure Laterality Date  . CESAREAN SECTION  2009   x 1    OB History    Gravida  2   Para  1   Term  1   Preterm      AB  1   Living  1     SAB  1   TAB      Ectopic      Multiple      Live Births  1            Home Medications    Prior to Admission medications   Medication Sig Start Date End Date Taking? Authorizing Provider  acetaminophen (TYLENOL) 500 MG tablet Take 500-1,000 mg by mouth every 6 (six) hours as needed.     [provider]  aspirin-acetaminophen-caffeine (EXCEDRIN MIGRAINE) [phone] MG tablet Take 1-2 tablets by mouth every 6 (six) hours as needed for headache.     [provider]  VALTREX 1 g tablet Take 1 tablet (1,000 mg total) by mouth 2 (two) times daily. 03/06/17 03/13/17  Starr Lake, CNM    Family History Family History  Problem Relation Age of Onset  . Asthma Mother   . Diabetes Father   . Hypertension Father     Social History Social History   Tobacco Use  . Smoking status: Current Every Day Smoker    Packs/day: 0.25    Years: 3.00    Pack years: 0.75    Types: Cigarettes  . Smokeless tobacco: Never Used  Substance Use Topics  . Alcohol use: Yes    Comment: Occasional  . Drug use: No    Comment: none     Allergies   Latex   Review of Systems Review of Systems   Physical Exam Triage Vital Signs ED Triage Vitals [07/17/19 1250]  Enc Vitals Group     BP 129/84     Pulse Rate 62     Resp 18     Temp 98.9 F (37.2 C)     Temp Source Oral  SpO2 99 %     Weight      Height      Head Circumference      Peak Flow      Pain Score 0     Pain Loc      Pain Edu?      Excl. in GC?    No data found.  Updated Vital Signs BP 129/84 (BP Location: Left Arm)   Pulse 62   Temp 98.9 F (37.2 C) (Oral)   Resp 18   LMP 07/11/2019   SpO2 99%    Physical Exam Constitutional:      General: She is not in acute distress.    Appearance: She is well-developed.  Cardiovascular:     Rate and Rhythm: Normal rate.  Pulmonary:     Effort: Pulmonary effort is normal.  Abdominal:     Palpations: Abdomen is soft. Abdomen is not rigid.     Tenderness: There is no abdominal tenderness. There is no guarding or rebound.  Genitourinary:    Comments: Denies sores, lesions, vaginal bleeding; no pelvic pain; gu exam deferred at this time, vaginal self swab collected.   Skin:    General: Skin is warm and dry.  Neurological:     Mental Status: She is alert and oriented to person, place, and time.      UC Treatments / Results  Labs (all labs ordered are listed, but only abnormal results are displayed) Labs  Reviewed  NOVEL CORONAVIRUS, NAA (HOSP ORDER, SEND-OUT TO REF LAB; TAT 18-24 HRS)  POC URINE PREG, ED  POCT PREGNANCY, URINE  CERVICOVAGINAL ANCILLARY ONLY    EKG   Radiology No results found.  Procedures Procedures (including critical care time)  Medications Ordered in UC Medications - No data to display  Initial Impression / Assessment and Plan / UC Course  I have reviewed the triage vital signs and the nursing notes.  Pertinent labs & imaging results that were available during my care of the patient were reviewed by me and considered in my medical decision making (see chart for details).     Non toxic. Afebrile. covid testing collected and pending. Vaginal cytology by self-swab collected and pending. Will notify of any positive findings and if any changes to treatment are needed.  Safe sex encouraged. Isolation due to covid exposure discussed. Return precautions provided. Patient verbalized understanding and agreeable to plan.   Final Clinical Impressions(s) / UC Diagnoses   Final diagnoses:  Exposure to COVID-19 virus  Vaginal discharge     Discharge Instructions     Self isolate until covid results are back and negative.  Will notify you of any positive findings and if any changes to treatment are needed.  Your negative results will be sent through your MyChart.     Please use condoms to prevent STD's.     ED Prescriptions    None     PDMP not reviewed this encounter.   Georgetta Haber, NP 07/17/19 [phone]

## 2019-07-17 NOTE — ED Triage Notes (Signed)
Pt here to get tested for COVID.Jillian Rowland. reports friend tested positive for COVID  Also needing STD check... sts she has some vag d/c... sexually active w/2 female partners w/occasional condom use.   Denies vag d/c, vomting  Pt is asymptomatic... A&O x4... No acute distress... ambulatory

## 2019-07-17 NOTE — Discharge Instructions (Signed)
Self isolate until covid results are back and negative.  Will notify you of any positive findings and if any changes to treatment are needed.  Your negative results will be sent through your MyChart.     Please use condoms to prevent STD's.

## 2019-07-19 ENCOUNTER — Ambulatory Visit (HOSPITAL_COMMUNITY)
Admission: EM | Admit: 2019-07-19 | Discharge: 2019-07-19 | Disposition: A | Discharge status: Home / Self Care | Payer: Medicaid Other | Attending: Family Medicine | Admitting: Family Medicine

## 2019-07-19 DIAGNOSIS — N760 Acute vaginitis: Secondary | ICD-10-CM | POA: Insufficient documentation

## 2019-07-19 LAB — NOVEL CORONAVIRUS, NAA (HOSP ORDER, SEND-OUT TO REF LAB; TAT 18-24 HRS): SARS-CoV-2, NAA: NOT DETECTED

## 2019-07-19 NOTE — ED Notes (Signed)
Pt educated on specimen collection, information given on where and when to obtain sample results.

## 2019-07-19 NOTE — ED Notes (Addendum)
Pt here for recollection only, this is from visit on 07/17/2019 and should not be charged as a visit.

## 2019-07-23 ENCOUNTER — Telehealth (HOSPITAL_COMMUNITY): Payer: Self-pay | Admitting: Emergency Medicine

## 2019-07-23 LAB — CERVICOVAGINAL ANCILLARY ONLY
Bacterial vaginitis: POSITIVE — AB
Candida vaginitis: NEGATIVE
Chlamydia: NEGATIVE
Neisseria Gonorrhea: NEGATIVE
Trichomonas: NEGATIVE

## 2019-07-23 MED ORDER — METRONIDAZOLE 0.75 % VA GEL: 1.0000 | Freq: Every day | VAGINAL | 0 refills | Status: AC

## 2019-07-23 MED ORDER — FLUCONAZOLE 150 MG PO TABS: 150.0000 mg | ORAL_TABLET | Freq: Once | ORAL | 0 refills | Status: AC

## 2019-07-23 NOTE — Telephone Encounter (Signed)
Bacterial vaginosis is positive. This was not treated at the urgent care visit.  Metrogel sent to pharmacy on record. Pt also requesting yeast pills due to medication giving her a yeast infection. Medicine sent to pharmacy per protocol.   Patient contacted by phone and made aware of    results. Pt verbalized understanding and had all questions answered.

## 2019-08-21 ENCOUNTER — Other Ambulatory Visit: Payer: Self-pay

## 2019-08-21 ENCOUNTER — Ambulatory Visit (HOSPITAL_COMMUNITY)
Admission: EM | Admit: 2019-08-21 | Discharge: 2019-08-21 | Disposition: A | Discharge status: Home / Self Care | Payer: Medicaid Other | Attending: Family Medicine | Admitting: Family Medicine

## 2019-08-21 ENCOUNTER — Encounter (HOSPITAL_COMMUNITY): Payer: Self-pay | Admitting: Emergency Medicine

## 2019-08-21 DIAGNOSIS — Z3202 Encounter for pregnancy test, result negative: Secondary | ICD-10-CM

## 2019-08-21 DIAGNOSIS — N760 Acute vaginitis: Secondary | ICD-10-CM | POA: Diagnosis not present

## 2019-08-21 LAB — POCT URINALYSIS DIP (DEVICE)
Bilirubin Urine: NEGATIVE
Glucose, UA: NEGATIVE mg/dL
Ketones, ur: NEGATIVE mg/dL
Nitrite: NEGATIVE
Protein, ur: NEGATIVE mg/dL
Specific Gravity, Urine: 1.025 (ref 1.005–1.030)
Urobilinogen, UA: 0.2 mg/dL (ref 0.0–1.0)
pH: 7 (ref 5.0–8.0)

## 2019-08-21 LAB — POC URINE PREG, ED: Preg Test, Ur: NEGATIVE

## 2019-08-21 LAB — POCT PREGNANCY, URINE: Preg Test, Ur: NEGATIVE

## 2019-08-21 MED ORDER — METRONIDAZOLE 500 MG PO TABS: 500.0000 mg | ORAL_TABLET | Freq: Two times a day (BID) | ORAL | 0 refills | Status: AC

## 2019-08-21 MED ORDER — ONDANSETRON 4 MG PO TBDP: 4.0000 mg | ORAL_TABLET | Freq: Three times a day (TID) | ORAL | 0 refills | Status: DC | PRN

## 2019-08-21 MED ORDER — FLUCONAZOLE 150 MG PO TABS: 150.0000 mg | ORAL_TABLET | Freq: Once | ORAL | 0 refills | Status: AC

## 2019-08-21 NOTE — ED Notes (Signed)
Sent to bathroom to collect specimens and given instructions

## 2019-08-21 NOTE — ED Provider Notes (Signed)
[facility]    CSN: [id number] Arrival date & time: 08/21/19  1920      History   Chief Complaint Chief Complaint  Patient presents with  . Abdominal Pain  . Appointment    1900    HPI Jillian Rowland is a 34 y.o. female history of recurrent bacterial vaginosis presenting today for evaluation of abdominal pain and vaginal discharge.  Patient states that for the past 3 to 4 days she has noticed some abnormal discharge with associated itching irritation as well as a slight odor.  She is concerned about possible yeast infection as well as BV.  Recently had last BV infection approximately 1 month ago and was treated with MetroGel as alternative to oral tablets which she has had nausea and vomiting.  She is unsure if her symptoms fully resolved.  Would like to also be screened for STDs, denies any known exposures.  Has had some mild abdominal pain today, but she is unsure if this is related to starting her menstrual cycle today.  She is not on any form of birth control.  Denies fever, nausea or vomiting.  Denies dysuria, increased frequency or urgency. Denies rashes or lesions.  HPI  Past Medical History:  Diagnosis Date  . Abnormal Pap smear   . Bacterial vaginal infection   . Bacterial vaginitis 10/24/2017  . Chlamydia   . Gonorrhea   . Hypertension   . Pollen allergy   . Yeast vaginitis     Patient Active Problem List   Diagnosis Date Noted  . Bacterial vaginitis 10/24/2017  . Chlamydia 08/18/2016    Past Surgical History:  Procedure Laterality Date  . CESAREAN SECTION  2009   x 1    OB History    Gravida  2   Para  1   Term  1   Preterm      AB  1   Living  1     SAB  1   TAB      Ectopic      Multiple      Live Births  1            Home Medications    Prior to Admission medications   Medication Sig Start Date End Date Taking? Authorizing Provider  losartan (COZAAR) 25 MG tablet Take 25 mg by mouth daily.   Yes [provider]  acetaminophen (TYLENOL) 500 MG tablet Take 500-1,000 mg by mouth every 6 (six) hours as needed.     [provider]  aspirin-acetaminophen-caffeine (EXCEDRIN MIGRAINE) [phone] MG tablet Take 1-2 tablets by mouth every 6 (six) hours as needed for headache.     [provider]  fluconazole (DIFLUCAN) 150 MG tablet Take 1 tablet (150 mg total) by mouth once for 1 dose. 08/21/19 08/21/19  Anelise Staron C, PA-C  metroNIDAZOLE (FLAGYL) 500 MG tablet Take 1 tablet (500 mg total) by mouth 2 (two) times daily for 7 days. 08/21/19 08/28/19  Angie Piercey C, PA-C  ondansetron (ZOFRAN ODT) 4 MG disintegrating tablet Take 1 tablet (4 mg total) by mouth every 8 (eight) hours as needed for nausea or vomiting (30-40 min prior to flagyl). 08/21/19   Promise Bushong C, PA-C  VALTREX 1 g tablet Take 1 tablet (1,000 mg total) by mouth 2 (two) times daily. 03/06/17 03/13/17  Starr Lake, CNM    Family History Family History  Problem Relation Age of Onset  . Asthma Mother   . Diabetes  Father   . Hypertension Father     Social History Social History   Tobacco Use  . Smoking status: Current Every Day Smoker    Packs/day: 0.25    Years: 3.00    Pack years: 0.75    Types: Cigarettes  . Smokeless tobacco: Never Used  Substance Use Topics  . Alcohol use: Yes    Comment: Occasional  . Drug use: No    Comment: none     Allergies   Latex   Review of Systems Review of Systems  Constitutional: Negative for fever.  Respiratory: Negative for shortness of breath.   Cardiovascular: Negative for chest pain.  Gastrointestinal: Negative for abdominal pain, diarrhea, nausea and vomiting.  Genitourinary: Positive for vaginal discharge. Negative for dysuria, flank pain, genital sores, hematuria, menstrual problem, vaginal bleeding and vaginal pain.  Musculoskeletal: Negative for back pain.  Skin: Negative for rash.  Neurological: Negative for dizziness,  light-headedness and headaches.     Physical Exam Triage Vital Signs ED Triage Vitals  Enc Vitals Group     BP 08/21/19 1940 (!) 142/94     Pulse Rate 08/21/19 1940 73     Resp --      Temp 08/21/19 1940 98.8 F (37.1 C)     Temp src --      SpO2 08/21/19 1940 96 %     Weight --      Height --      Head Circumference --      Peak Flow --      Pain Score 08/21/19 1937 8     Pain Loc --      Pain Edu? --      Excl. in GC? --    No data found.  Updated Vital Signs BP (!) 142/94 (BP Location: Right Arm) Comment (BP Location): large cuff.takes blood pressure medicine at night, has not had medicine yet  Pulse 73   Temp 98.8 F (37.1 C)   LMP 06/21/2019   SpO2 96%   Visual Acuity Right Eye Distance:   Left Eye Distance:   Bilateral Distance:    Right Eye Near:   Left Eye Near:    Bilateral Near:     Physical Exam Vitals and nursing note reviewed.  Constitutional:      Appearance: She is well-developed.     Comments: No acute distress  HENT:     Head: Normocephalic and atraumatic.     Nose: Nose normal.  Eyes:     Conjunctiva/sclera: Conjunctivae normal.  Cardiovascular:     Rate and Rhythm: Normal rate.  Pulmonary:     Effort: Pulmonary effort is normal. No respiratory distress.     Comments: Breathing comfortably at rest, CTABL, no wheezing, rales or other adventitious sounds auscultated Abdominal:     General: There is no distension.     Palpations: Abdomen is soft.     Tenderness: There is abdominal tenderness.     Comments: Soft, nondistended, mild suprapubic tenderness, negative rebound  Musculoskeletal:        General: Normal range of motion.     Cervical back: Neck supple.  Skin:    General: Skin is warm and dry.  Neurological:     Mental Status: She is alert and oriented to person, place, and time.      UC Treatments / Results  Labs (all labs ordered are listed, but only abnormal results are displayed) Labs Reviewed  POCT URINALYSIS DIP  (DEVICE) - Abnormal; Notable for  the following components:      Result Value   Hgb urine dipstick MODERATE (*)    Leukocytes,Ua SMALL (*)    All other components within normal limits  POC URINE PREG, ED  CERVICOVAGINAL ANCILLARY ONLY    EKG   Radiology No results found.  Procedures Procedures (including critical care time)  Medications Ordered in UC Medications - No data to display  Initial Impression / Assessment and Plan / UC Course  I have reviewed the triage vital signs and the nursing notes.  Pertinent labs & imaging results that were available during my care of the patient were reviewed by me and considered in my medical decision making (see chart for details).     Likely vaginitis from yeast versus BV.  Swab pending also screen for STDs.  Empirically treating with Flagyl and Diflucan today.  Monitor for resolution of symptoms.  Will alter treatment as needed if results abnormal.  Discussed strict return precautions. Patient verbalized understanding and is agreeable with plan.  Final Clinical Impressions(s) / UC Diagnoses   Final diagnoses:  Vaginitis and vulvovaginitis     Discharge Instructions     Begin taking metronidazole twice daily for 1 week to treat for bacterial vaginosis.  Please try using Zofran 30 minutes prior to medicine to help with nausea/vomiting side effects.  Take 1 Diflucan today, may repeat after completing course of Flagyl for yeast.  We are testing you for Gonorrhea, Chlamydia, Trichomonas, Yeast and Bacterial Vaginosis. We will call you if anything is positive and let you know if you require any further treatment. Please inform partners of any positive results.   Please return if symptoms not improving with treatment, development of fever, nausea, vomiting, abdominal pain.      ED Prescriptions    Medication Sig Dispense Auth. Provider   fluconazole (DIFLUCAN) 150 MG tablet Take 1 tablet (150 mg total) by mouth once for 1 dose. 2  tablet Fitz Matsuo C, PA-C   metroNIDAZOLE (FLAGYL) 500 MG tablet Take 1 tablet (500 mg total) by mouth 2 (two) times daily for 7 days. 14 tablet Jerrick Farve C, PA-C   ondansetron (ZOFRAN ODT) 4 MG disintegrating tablet Take 1 tablet (4 mg total) by mouth every 8 (eight) hours as needed for nausea or vomiting (30-40 min prior to flagyl). 20 tablet Philipp Callegari, [place] C, PA-C     PDMP not reviewed this encounter.   Lew Dawes, [place] 08/21/19 2008

## 2019-08-21 NOTE — Discharge Instructions (Addendum)
Begin taking metronidazole twice daily for 1 week to treat for bacterial vaginosis.  Please try using Zofran 30 minutes prior to medicine to help with nausea/vomiting side effects.  Take 1 Diflucan today, may repeat after completing course of Flagyl for yeast.  We are testing you for Gonorrhea, Chlamydia, Trichomonas, Yeast and Bacterial Vaginosis. We will call you if anything is positive and let you know if you require any further treatment. Please inform partners of any positive results.   Please return if symptoms not improving with treatment, development of fever, nausea, vomiting, abdominal pain.

## 2019-08-21 NOTE — ED Triage Notes (Signed)
Patient reports abdominal pain, lower abdominal pain.  Patient reports vaginal discharge-onset 3-4 days ago

## 2019-08-23 LAB — CERVICOVAGINAL ANCILLARY ONLY
Bacterial vaginitis: POSITIVE — AB
Candida vaginitis: POSITIVE — AB
Chlamydia: NEGATIVE
Neisseria Gonorrhea: NEGATIVE
Trichomonas: NEGATIVE

## 2019-10-09 ENCOUNTER — Ambulatory Visit (HOSPITAL_COMMUNITY)
Admission: EM | Admit: 2019-10-09 | Discharge: 2019-10-09 | Disposition: A | Discharge status: Home / Self Care | Payer: 59 | Attending: Family Medicine | Admitting: Family Medicine

## 2019-10-09 ENCOUNTER — Encounter (HOSPITAL_COMMUNITY): Payer: Self-pay

## 2019-10-09 ENCOUNTER — Other Ambulatory Visit: Payer: Self-pay

## 2019-10-09 DIAGNOSIS — Z113 Encounter for screening for infections with a predominantly sexual mode of transmission: Secondary | ICD-10-CM | POA: Diagnosis not present

## 2019-10-09 DIAGNOSIS — Z3202 Encounter for pregnancy test, result negative: Secondary | ICD-10-CM | POA: Diagnosis not present

## 2019-10-09 DIAGNOSIS — N898 Other specified noninflammatory disorders of vagina: Secondary | ICD-10-CM

## 2019-10-09 LAB — POCT URINALYSIS DIP (DEVICE)
Bilirubin Urine: NEGATIVE
Glucose, UA: NEGATIVE mg/dL
Ketones, ur: NEGATIVE mg/dL
Nitrite: NEGATIVE
Protein, ur: 30 mg/dL — AB
Specific Gravity, Urine: >=1.03 (ref 1.005–1.030)
Urobilinogen, UA: 1 mg/dL (ref 0.0–1.0)
pH: 6 (ref 5.0–8.0)

## 2019-10-09 LAB — RAPID HIV SCREEN (HIV 1/2 AB+AG)
HIV 1/2 Antibodies: NONREACTIVE
HIV-1 P24 Antigen - HIV24: NONREACTIVE

## 2019-10-09 LAB — POCT PREGNANCY, URINE: Preg Test, Ur: NEGATIVE

## 2019-10-09 LAB — POC URINE PREG, ED
Preg Test, Ur: NEGATIVE
Preg Test, Ur: NEGATIVE

## 2019-10-09 NOTE — Discharge Instructions (Addendum)
Your STD tests are pending.  If your test results are positive, we will call you.  You may need additional treatment and your partner(s) may also need treatment.      

## 2019-10-09 NOTE — ED Triage Notes (Signed)
Pt presents with complaints of lower abdominal pain and vaginal discharge x 2 days. States that she is concerned for bv.

## 2019-10-09 NOTE — ED Provider Notes (Signed)
[facility]    CSN: [id number] Arrival date & time: 10/09/19  1740      History   Chief Complaint Chief Complaint  Patient presents with  . Abdominal Pain  . Vaginal Discharge    HPI Jillian Rowland is a 34 y.o. female.   Reports thin, vaginal discharge for the last 3 days.  Reports that the discharge has a mild odor.  Reports that she thought that this was a yeast infection, and was self treated at home.  Reports that she was also recently treated for BV, and finished antibiotics about a week ago.  She thinks that the yeast may have come from BV treatment, so she treated the vaginal discharge so, but discharge is still present.  Patient also requesting UA and pregnancy test today, as well as STD testing, including RPR and HIV.  ROS Per HPI  The history is provided by the patient.    Past Medical History:  Diagnosis Date  . Abnormal Pap smear   . Bacterial vaginal infection   . Bacterial vaginitis 10/24/2017  . Chlamydia   . Gonorrhea   . Hypertension   . Pollen allergy   . Yeast vaginitis     Patient Active Problem List   Diagnosis Date Noted  . Bacterial vaginitis 10/24/2017  . Chlamydia 08/18/2016    Past Surgical History:  Procedure Laterality Date  . CESAREAN SECTION  2009   x 1    OB History    Gravida  2   Para  1   Term  1   Preterm      AB  1   Living  1     SAB  1   TAB      Ectopic      Multiple      Live Births  1            Home Medications    Prior to Admission medications   Medication Sig Start Date End Date Taking? Authorizing Provider  acetaminophen (TYLENOL) 500 MG tablet Take 500-1,000 mg by mouth every 6 (six) hours as needed.     [provider]  aspirin-acetaminophen-caffeine (EXCEDRIN MIGRAINE) [phone] MG tablet Take 1-2 tablets by mouth every 6 (six) hours as needed for headache.     [provider]  losartan (COZAAR) 25 MG tablet Take 25 mg by mouth daily.    [provider]  ondansetron (ZOFRAN ODT) 4 MG disintegrating tablet Take 1 tablet (4 mg total) by mouth every 8 (eight) hours as needed for nausea or vomiting (30-40 min prior to flagyl). 08/21/19   Wieters, Hallie C, PA-C  VALTREX 1 g tablet Take 1 tablet (1,000 mg total) by mouth 2 (two) times daily. 03/06/17 03/13/17  Marylene Land, CNM    Family History Family History  Problem Relation Age of Onset  . Asthma Mother   . Diabetes Father   . Hypertension Father     Social History Social History   Tobacco Use  . Smoking status: Current Every Day Smoker    Packs/day: 0.25    Years: 3.00    Pack years: 0.75    Types: Cigarettes  . Smokeless tobacco: Never Used  Substance Use Topics  . Alcohol use: Yes    Comment: Occasional  . Drug use: No    Comment: none     Allergies   Latex   Review of Systems Review of Systems   Physical Exam Triage Vital Signs  ED Triage Vitals  Enc Vitals Group     BP      Pulse      Resp      Temp      Temp src      SpO2      Weight      Height      Head Circumference      Peak Flow      Pain Score      Pain Loc      Pain Edu?      Excl. in GC?    No data found.  Updated Vital Signs BP (!) 147/95   Pulse 75   Temp 98.1 F (36.7 C)   Resp 18   SpO2 98%      Physical Exam Vitals and nursing note reviewed.  Constitutional:      General: She is not in acute distress.    Appearance: She is well-developed. She is obese. She is not ill-appearing.  HENT:     Head: Normocephalic and atraumatic.  Eyes:     Conjunctiva/sclera: Conjunctivae normal.  Cardiovascular:     Rate and Rhythm: Normal rate and regular rhythm.     Heart sounds: Normal heart sounds. No murmur.  Pulmonary:     Effort: Pulmonary effort is normal. No respiratory distress.     Breath sounds: Normal breath sounds. No stridor. No wheezing, rhonchi or rales.  Chest:     Chest wall: No tenderness.  Abdominal:     General: Bowel sounds are normal.  There is no distension.     Palpations: Abdomen is soft. There is no mass.     Tenderness: There is no abdominal tenderness. There is no guarding or rebound.     Hernia: No hernia is present.  Genitourinary:    Vagina: Vaginal discharge present.  Musculoskeletal:        General: Normal range of motion.     Cervical back: Neck supple.  Skin:    General: Skin is warm and dry.     Capillary Refill: Capillary refill takes less than 2 seconds.  Neurological:     General: No focal deficit present.     Mental Status: She is alert and oriented to person, place, and time.  Psychiatric:        Mood and Affect: Mood normal.        Behavior: Behavior normal.      UC Treatments / Results  Labs (all labs ordered are listed, but only abnormal results are displayed) Labs Reviewed  POCT URINALYSIS DIP (DEVICE) - Abnormal; Notable for the following components:      Result Value   Hgb urine dipstick SMALL (*)    Protein, ur 30 (*)    Leukocytes,Ua TRACE (*)    All other components within normal limits  RAPID HIV SCREEN (HIV 1/2 AB+AG)  RPR  POC URINE PREG, ED  POCT PREGNANCY, URINE  POC URINE PREG, ED  CERVICOVAGINAL ANCILLARY ONLY    EKG   Radiology No results found.  Procedures Procedures (including critical care time)  Medications Ordered in UC Medications - No data to display  Initial Impression / Assessment and Plan / UC Course  I have reviewed the triage vital signs and the nursing notes.  Pertinent labs & imaging results that were available during my care of the patient were reviewed by me and considered in my medical decision making (see chart for details).     Presents with vaginal discharge  today, declined pelvic exam today.  Patient self swabbed for STD testing, UA in office negative for infection, U pregnant negative as well.  Will inform patient of STD testing results, RPR and HIV blood testing was obtained.  Instructed not to have sex until tests are back and  negative.  If patient is positive for STDs, will inform patient and treat accordingly.  Instructed to follow-up with primary care provider as needed. Final Clinical Impressions(s) / UC Diagnoses   Final diagnoses:  Screen for STD (sexually transmitted disease)  Vaginal discharge  Pregnancy test negative     Discharge Instructions       Your STD tests are pending.  If your test results are positive, we will call you.  You may need additional treatment and your partner(s) may also need treatment.          ED Prescriptions    None     PDMP not reviewed this encounter.   Faustino Congress, NP 10/10/19 1408

## 2019-10-10 LAB — RPR: RPR Ser Ql: NONREACTIVE

## 2019-10-11 ENCOUNTER — Telehealth (HOSPITAL_COMMUNITY): Payer: Self-pay | Admitting: Emergency Medicine

## 2019-10-11 ENCOUNTER — Encounter (HOSPITAL_COMMUNITY): Payer: Self-pay

## 2019-10-11 LAB — CERVICOVAGINAL ANCILLARY ONLY
Bacterial vaginitis: POSITIVE — AB
Candida vaginitis: NEGATIVE
Chlamydia: NEGATIVE
Neisseria Gonorrhea: NEGATIVE
Trichomonas: NEGATIVE

## 2019-10-11 MED ORDER — METRONIDAZOLE 500 MG PO TABS: 500.0000 mg | ORAL_TABLET | Freq: Two times a day (BID) | ORAL | 0 refills | Status: AC

## 2019-10-11 NOTE — Telephone Encounter (Signed)
Bacterial vaginosis is positive. Pt needs treatment. Flagyl 500 mg BID x 7 days #14 no refills sent to patients pharmacy of choice.    Attempted to reach patient. No answer at this time. Voicemail left.   If you have any questions, you may call me at [phone]    

## 2019-12-05 ENCOUNTER — Other Ambulatory Visit: Payer: Self-pay

## 2019-12-05 ENCOUNTER — Emergency Department (HOSPITAL_COMMUNITY)
Admission: EM | Admit: 2019-12-05 | Discharge: 2019-12-06 | Disposition: A | Discharge status: Home / Self Care | Payer: 59 | Attending: Emergency Medicine | Admitting: Emergency Medicine

## 2019-12-05 ENCOUNTER — Encounter (HOSPITAL_COMMUNITY): Payer: Self-pay | Admitting: Emergency Medicine

## 2019-12-05 DIAGNOSIS — F17210 Nicotine dependence, cigarettes, uncomplicated: Secondary | ICD-10-CM | POA: Insufficient documentation

## 2019-12-05 DIAGNOSIS — N946 Dysmenorrhea, unspecified: Secondary | ICD-10-CM | POA: Insufficient documentation

## 2019-12-05 DIAGNOSIS — N939 Abnormal uterine and vaginal bleeding, unspecified: Secondary | ICD-10-CM | POA: Insufficient documentation

## 2019-12-05 LAB — CBC
HCT: 30.3 % — ABNORMAL LOW (ref 36.0–46.0)
Hemoglobin: 10 g/dL — ABNORMAL LOW (ref 12.0–15.0)
MCH: 29.1 pg (ref 26.0–34.0)
MCHC: 33 g/dL (ref 30.0–36.0)
MCV: 88.1 fL (ref 80.0–100.0)
Platelets: 367 10*3/uL (ref 150–400)
RBC: 3.44 MIL/uL — ABNORMAL LOW (ref 3.87–5.11)
RDW: 13.4 % (ref 11.5–15.5)
WBC: 8.7 10*3/uL (ref 4.0–10.5)
nRBC: 0 % (ref 0.0–0.2)

## 2019-12-05 LAB — URINALYSIS, ROUTINE W REFLEX MICROSCOPIC

## 2019-12-05 LAB — URINALYSIS, MICROSCOPIC (REFLEX): RBC / HPF: >50 RBC/hpf (ref 0–5)

## 2019-12-05 LAB — I-STAT BETA HCG BLOOD, ED (MC, WL, AP ONLY): I-stat hCG, quantitative: <5 m[IU]/mL (ref <5)

## 2019-12-05 NOTE — ED Triage Notes (Signed)
Pt endorses heavy vaginal bleeding since last Tuesday. Bilateral lower abd pain for 3 days.

## 2019-12-06 LAB — LIPASE, BLOOD: Lipase: 18 U/L (ref 11–51)

## 2019-12-06 LAB — COMPREHENSIVE METABOLIC PANEL
ALT: 18 U/L (ref 0–44)
AST: 16 U/L (ref 15–41)
Albumin: 3.4 g/dL — ABNORMAL LOW (ref 3.5–5.0)
Alkaline Phosphatase: 50 U/L (ref 38–126)
Anion gap: 8 (ref 5–15)
BUN: 11 mg/dL (ref 6–20)
CO2: 26 mmol/L (ref 22–32)
Calcium: 8.9 mg/dL (ref 8.9–10.3)
Chloride: 105 mmol/L (ref 98–111)
Creatinine, Ser: 0.64 mg/dL (ref 0.44–1.00)
GFR calc Af Amer: >60 mL/min (ref >60)
GFR calc non Af Amer: >60 mL/min (ref >60)
Glucose, Bld: 136 mg/dL — ABNORMAL HIGH (ref 70–99)
Potassium: 3.4 mmol/L — ABNORMAL LOW (ref 3.5–5.1)
Sodium: 139 mmol/L (ref 135–145)
Total Bilirubin: 0.7 mg/dL (ref 0.3–1.2)
Total Protein: 6.7 g/dL (ref 6.5–8.1)

## 2019-12-06 NOTE — Discharge Instructions (Addendum)
Please take a multi-vitamin with iron to help your body replenish your blood.  Tonight you hemoglobin level is 10.0.  I recommend that you be seen by an OBGYN.  More workup is needed to figure out why you are bleeding.  This can be done on an outpatient basis with an OBGYN.  Please contact the group listed and make an appointment.

## 2019-12-06 NOTE — ED Provider Notes (Signed)
[facility] EMERGENCY DEPARTMENT Provider Note   CSN: [id number] Arrival date & time: 12/05/19  1833     History Chief Complaint  Patient presents with  . Abdominal Pain  . Vaginal Bleeding    Jillian Rowland is a 34 y.o. female.  Patient presents to the emergency department with a chief complaint of vaginal bleeding.  She states that she has been having heavier than normal vaginal bleeding for the past 2 weeks.  She reports history of irregular periods.  She reports some associated menstrual cramps.  She denies any fever chills.  Denies any other associated symptoms.  She has not seen anyone for these symptoms.  She does not currently have an OB/GYN.  She reports using about a dozen pads a day over the past few days.  The history is provided by the patient. No language interpreter was used.       Past Medical History:  Diagnosis Date  . Abnormal Pap smear   . Bacterial vaginal infection   . Bacterial vaginitis 10/24/2017  . Chlamydia   . Gonorrhea   . Hypertension   . Pollen allergy   . Yeast vaginitis     Patient Active Problem List   Diagnosis Date Noted  . Bacterial vaginitis 10/24/2017  . Chlamydia 08/18/2016    Past Surgical History:  Procedure Laterality Date  . CESAREAN SECTION  2009   x 1     OB History    Gravida  2   Para  1   Term  1   Preterm      AB  1   Living  1     SAB  1   TAB      Ectopic      Multiple      Live Births  1           Family History  Problem Relation Age of Onset  . Asthma Mother   . Diabetes Father   . Hypertension Father     Social History   Tobacco Use  . Smoking status: Current Every Day Smoker    Packs/day: 0.25    Years: 3.00    Pack years: 0.75    Types: Cigarettes  . Smokeless tobacco: Never Used  Substance Use Topics  . Alcohol use: Yes    Comment: Occasional  . Drug use: No    Comment: none    Home Medications Prior to Admission medications   Medication  Sig Start Date End Date Taking? Authorizing Provider  acetaminophen (TYLENOL) 500 MG tablet Take 500-1,000 mg by mouth every 6 (six) hours as needed.     [provider]  aspirin-acetaminophen-caffeine (EXCEDRIN MIGRAINE) [phone] MG tablet Take 1-2 tablets by mouth every 6 (six) hours as needed for headache.     [provider]  losartan (COZAAR) 25 MG tablet Take 25 mg by mouth daily.    [provider]  ondansetron (ZOFRAN ODT) 4 MG disintegrating tablet Take 1 tablet (4 mg total) by mouth every 8 (eight) hours as needed for nausea or vomiting (30-40 min prior to flagyl). 08/21/19   Wieters, Hallie C, PA-C  VALTREX 1 g tablet Take 1 tablet (1,000 mg total) by mouth 2 (two) times daily. 03/06/17 03/13/17  Marylene Land, CNM    Allergies    Latex  Review of Systems   Review of Systems  All other systems reviewed and are negative.   Physical Exam Updated Vital Signs BP 127/90 (  BP Location: Right Arm)   Pulse 75   Temp 98.3 F (36.8 C) (Oral)   Resp 18   SpO2 100%   Physical Exam Vitals and nursing note reviewed.  Constitutional:      General: She is not in acute distress.    Appearance: She is well-developed.  HENT:     Head: Normocephalic and atraumatic.  Eyes:     Conjunctiva/sclera: Conjunctivae normal.  Cardiovascular:     Rate and Rhythm: Normal rate and regular rhythm.     Heart sounds: No murmur.  Pulmonary:     Effort: Pulmonary effort is normal. No respiratory distress.  Abdominal:     General: There is no distension.     Tenderness: There is no abdominal tenderness. There is no guarding.  Musculoskeletal:     Cervical back: Neck supple.     Comments: Moves all extremities  Skin:    General: Skin is warm and dry.  Neurological:     Mental Status: She is alert and oriented to person, place, and time.  Psychiatric:        Mood and Affect: Mood normal.        Behavior: Behavior normal.     ED Results / Procedures /  Treatments   Labs (all labs ordered are listed, but only abnormal results are displayed) Labs Reviewed  COMPREHENSIVE METABOLIC PANEL - Abnormal; Notable for the following components:      Result Value   Potassium 3.4 (*)    Glucose, Bld 136 (*)    Albumin 3.4 (*)    All other components within normal limits  CBC - Abnormal; Notable for the following components:   RBC 3.44 (*)    Hemoglobin 10.0 (*)    HCT 30.3 (*)    All other components within normal limits  URINALYSIS, ROUTINE W REFLEX MICROSCOPIC - Abnormal; Notable for the following components:   Color, Urine RED (*)    APPearance TURBID (*)    Glucose, UA   (*)    Value: TEST NOT REPORTED DUE TO COLOR INTERFERENCE OF URINE PIGMENT   Hgb urine dipstick   (*)    Value: TEST NOT REPORTED DUE TO COLOR INTERFERENCE OF URINE PIGMENT   Bilirubin Urine   (*)    Value: TEST NOT REPORTED DUE TO COLOR INTERFERENCE OF URINE PIGMENT   Ketones, ur   (*)    Value: TEST NOT REPORTED DUE TO COLOR INTERFERENCE OF URINE PIGMENT   Protein, ur   (*)    Value: TEST NOT REPORTED DUE TO COLOR INTERFERENCE OF URINE PIGMENT   Nitrite   (*)    Value: TEST NOT REPORTED DUE TO COLOR INTERFERENCE OF URINE PIGMENT   Leukocytes,Ua   (*)    Value: TEST NOT REPORTED DUE TO COLOR INTERFERENCE OF URINE PIGMENT   All other components within normal limits  URINALYSIS, MICROSCOPIC (REFLEX) - Abnormal; Notable for the following components:   Bacteria, UA MANY (*)    All other components within normal limits  LIPASE, BLOOD  I-STAT BETA HCG BLOOD, ED ([facility], [facility], [facility] ONLY)    EKG None  Radiology No results found.  Procedures Procedures (including critical care time)  Medications Ordered in ED Medications - No data to display  ED Course  I have reviewed the triage vital signs and the nursing notes.  Pertinent labs & imaging results that were available during my care of the patient were reviewed by me and considered in my medical decision making (  see  chart for details).    MDM Rules/Calculators/A&P                       This patient complains of vaginal bleeding, this involves an extensive number of treatment options, and is a complaint that carries with it a high risk of complications and morbidity.  The differential diagnosis includes ectopic pregnancy, dysfunctional uterine bleeding.  I ordered, reviewed, and interpreted labs, which included CBC notable for hemoglobin of 10.0, CMP notable for potassium of 3.4, hCG is negative.  Given patient's reassuring vitals and hemoglobin of 10.0, do not feel patient needs emergent transfusion.  Symptoms have been ongoing for about 2 weeks.  Pregnancy test is negative. I have advised her to follow-up with OB/GYN for further work-up to determine the cause of her bleeding.  I discussed the case with Dr. Nicanor Alcon who agrees with the plan for OB/GYN follow-up.    Final Clinical Impression(s) / ED Diagnoses Final diagnoses:  Vaginal bleeding    Rx / DC Orders ED Discharge Orders    None       Roxy Horseman, PA-C 12/06/19 0455    Palumbo, April, MD 12/06/19 [phone]

## 2020-06-30 ENCOUNTER — Encounter (HOSPITAL_COMMUNITY): Payer: Self-pay

## 2020-06-30 ENCOUNTER — Ambulatory Visit (HOSPITAL_COMMUNITY)
Admission: EM | Admit: 2020-06-30 | Discharge: 2020-06-30 | Disposition: A | Discharge status: Home / Self Care | Payer: 59 | Attending: Emergency Medicine | Admitting: Emergency Medicine

## 2020-06-30 ENCOUNTER — Other Ambulatory Visit: Payer: Self-pay

## 2020-06-30 DIAGNOSIS — N898 Other specified noninflammatory disorders of vagina: Secondary | ICD-10-CM | POA: Diagnosis present

## 2020-06-30 DIAGNOSIS — R030 Elevated blood-pressure reading, without diagnosis of hypertension: Secondary | ICD-10-CM | POA: Insufficient documentation

## 2020-06-30 LAB — POCT URINALYSIS DIPSTICK, ED / UC
Bilirubin Urine: NEGATIVE
Glucose, UA: NEGATIVE mg/dL
Hgb urine dipstick: NEGATIVE
Ketones, ur: NEGATIVE mg/dL
Leukocytes,Ua: NEGATIVE
Nitrite: NEGATIVE
Protein, ur: NEGATIVE mg/dL
Specific Gravity, Urine: 1.02 (ref 1.005–1.030)
Urobilinogen, UA: 0.2 mg/dL (ref 0.0–1.0)
pH: 7 (ref 5.0–8.0)

## 2020-06-30 LAB — POC URINE PREG, ED: Preg Test, Ur: NEGATIVE

## 2020-06-30 MED ORDER — METRONIDAZOLE 500 MG PO TABS: 500.0000 mg | ORAL_TABLET | Freq: Two times a day (BID) | ORAL | 0 refills | Status: DC

## 2020-06-30 MED ORDER — FLUCONAZOLE 150 MG PO TABS: 150.0000 mg | ORAL_TABLET | Freq: Every day | ORAL | 0 refills | Status: DC

## 2020-06-30 NOTE — ED Triage Notes (Signed)
Pt c/o vaginal discharge with odor, lower abdominal pain, general malaise for approx 3 weeks. Also reports nausea and urinary frequency for approx 4-5 days.   LMP: beginning of September; took home pregnancy test approx 1 month ago and it was negative.  Has not taken losartaan for approx 4 days.   Denies v/d, fever, chills.

## 2020-06-30 NOTE — ED Provider Notes (Signed)
[facility]    CSN: [id number] Arrival date & time: 06/30/20  1112      History   Chief Complaint Chief Complaint  Patient presents with  . Abdominal Pain  . Vaginal Discharge    HPI Jillian Rowland is a 34 y.o. female.   Patient presents with thin, white, malodorous vaginal discharge x3 weeks.  She states this is similar to previous episodes of BV.  She also reports urinary frequency, nausea, lower abdominal discomfort.  She denies fever, chills, back pain, pelvic pain, rash, lesions, or other symptoms.  No treatments attempted at home.  Her medical history includes hypertension, gonorrhea, chlamydia, bacterial vaginitis, candidal vaginitis.  Patient states she has not taken her blood pressure medication in 4 days because she forgot.  She denies focal weakness, chest pain, shortness of breath, or other symptoms.  The history is provided by the patient and medical records.    Past Medical History:  Diagnosis Date  . Abnormal Pap smear   . Bacterial vaginal infection   . Bacterial vaginitis 10/24/2017  . Chlamydia   . Gonorrhea   . Hypertension   . Pollen allergy   . Yeast vaginitis     Patient Active Problem List   Diagnosis Date Noted  . Bacterial vaginitis 10/24/2017  . Chlamydia 08/18/2016    Past Surgical History:  Procedure Laterality Date  . CESAREAN SECTION  2009   x 1    OB History    Gravida  2   Para  1   Term  1   Preterm      AB  1   Living  1     SAB  1   TAB      Ectopic      Multiple      Live Births  1            Home Medications    Prior to Admission medications   Medication Sig Start Date End Date Taking? Authorizing Provider  acetaminophen (TYLENOL) 500 MG tablet Take 500-1,000 mg by mouth every 6 (six) hours as needed.    Yes [provider]  losartan (COZAAR) 25 MG tablet Take 25 mg by mouth daily.   Yes [provider]  aspirin-acetaminophen-caffeine (EXCEDRIN MIGRAINE) [phone]  MG tablet Take 1-2 tablets by mouth every 6 (six) hours as needed for headache.     [provider]  cetirizine (ZYRTEC ALLERGY) 10 MG tablet Zyrtec 10 mg tablet  Take 1 tablet every day by oral route. 01/14/16   [provider]  fluconazole (DIFLUCAN) 150 MG tablet Take 1 tablet (150 mg total) by mouth daily. Take one tablet today.  May repeat in 3 days. 06/30/20   Mickie Bail, NP  metroNIDAZOLE (FLAGYL) 500 MG tablet Take 1 tablet (500 mg total) by mouth 2 (two) times daily. 06/30/20   Mickie Bail, NP  ondansetron (ZOFRAN ODT) 4 MG disintegrating tablet Take 1 tablet (4 mg total) by mouth every 8 (eight) hours as needed for nausea or vomiting (30-40 min prior to flagyl). 08/21/19   Wieters, Hallie C, PA-C  VALTREX 1 g tablet Take 1 tablet (1,000 mg total) by mouth 2 (two) times daily. 03/06/17 03/13/17  Marylene Land, CNM    Family History Family History  Problem Relation Age of Onset  . Asthma Mother   . Diabetes Father   . Hypertension Father     Social History Social History   Tobacco Use  .  Smoking status: Current Every Day Smoker    Packs/day: 0.25    Years: 3.00    Pack years: 0.75    Types: Cigarettes  . Smokeless tobacco: Never Used  Substance Use Topics  . Alcohol use: Yes    Comment: Occasional  . Drug use: No    Comment: none     Allergies   Latex   Review of Systems Review of Systems  Constitutional: Negative for chills and fever.  HENT: Negative for ear pain and sore throat.   Eyes: Negative for pain and visual disturbance.  Respiratory: Negative for cough and shortness of breath.   Cardiovascular: Negative for chest pain and palpitations.  Gastrointestinal: Positive for abdominal pain and nausea. Negative for diarrhea and vomiting.  Genitourinary: Positive for frequency and vaginal discharge. Negative for dysuria and hematuria.  Musculoskeletal: Negative for arthralgias and back pain.  Skin: Negative for color change and  rash.  Neurological: Negative for seizures, syncope, weakness and numbness.  All other systems reviewed and are negative.    Physical Exam Triage Vital Signs ED Triage Vitals  Enc Vitals Group     BP      Pulse      Resp      Temp      Temp src      SpO2      Weight      Height      Head Circumference      Peak Flow      Pain Score      Pain Loc      Pain Edu?      Excl. in GC?    No data found.  Updated Vital Signs BP (!) 172/121 (BP Location: Right Wrist)   Pulse 82   Temp 98.3 F (36.8 C) (Oral)   Resp 18   LMP 04/09/2020 (Approximate)   SpO2 98%   Visual Acuity Right Eye Distance:   Left Eye Distance:   Bilateral Distance:    Right Eye Near:   Left Eye Near:    Bilateral Near:     Physical Exam Vitals and nursing note reviewed.  Constitutional:      General: She is not in acute distress.    Appearance: She is well-developed. She is not ill-appearing.  HENT:     Head: Normocephalic and atraumatic.     Mouth/Throat:     Mouth: Mucous membranes are moist.  Eyes:     Conjunctiva/sclera: Conjunctivae normal.  Cardiovascular:     Rate and Rhythm: Normal rate and regular rhythm.     Heart sounds: Normal heart sounds.  Pulmonary:     Effort: Pulmonary effort is normal. No respiratory distress.     Breath sounds: Normal breath sounds.  Abdominal:     General: Bowel sounds are normal.     Palpations: Abdomen is soft.     Tenderness: There is no abdominal tenderness. There is no right CVA tenderness, left CVA tenderness, guarding or rebound.  Musculoskeletal:     Cervical back: Neck supple.  Skin:    General: Skin is warm and dry.     Findings: No rash.  Neurological:     General: No focal deficit present.     Mental Status: She is alert and oriented to person, place, and time.     Gait: Gait normal.  Psychiatric:        Mood and Affect: Mood normal.        Behavior: Behavior normal.  UC Treatments / Results  Labs (all labs ordered are  listed, but only abnormal results are displayed) Labs Reviewed  POCT URINALYSIS DIPSTICK, ED / UC  POC URINE PREG, ED  CERVICOVAGINAL ANCILLARY ONLY    EKG   Radiology No results found.  Procedures Procedures (including critical care time)  Medications Ordered in UC Medications - No data to display  Initial Impression / Assessment and Plan / UC Course  I have reviewed the triage vital signs and the nursing notes.  Pertinent labs & imaging results that were available during my care of the patient were reviewed by me and considered in my medical decision making (see chart for details).   Vaginal discharge.  Elevated blood pressure reading.  Urine normal.  Urine pregnancy negative.  Treating with metronidazole and fluconazole; patient declines other STD treatments until the test results are back.  Instructed patient to abstain from sexual activity until her test results are back.  Discussed that she and her sexual partner may require treatment at that time.  Discussed with patient that her blood pressure is elevated; she agrees to take her blood pressure medication when she gets home and follow-up with her PCP.  Patient agrees to plan of care.   Final Clinical Impressions(s) / UC Diagnoses   Final diagnoses:  Vaginal discharge  Elevated blood pressure reading     Discharge Instructions     Your urine is normal.  Your urine pregnancy is negative.    Take metronidazole and fluconazole as directed.     Your vaginal tests are pending.  If your test results are positive, we will call you.  You and your sexual partner(s) may require treatment at that time.  Do not have sexual activity until the test results are back.  Your blood pressure is elevated today at 172/121.  Please have this rechecked by your primary care provider in next week.            ED Prescriptions    Medication Sig Dispense Auth. Provider   fluconazole (DIFLUCAN) 150 MG tablet Take 1 tablet (150 mg total)  by mouth daily. Take one tablet today.  May repeat in 3 days. 2 tablet Mickie Bail, NP   metroNIDAZOLE (FLAGYL) 500 MG tablet Take 1 tablet (500 mg total) by mouth 2 (two) times daily. 14 tablet Mickie Bail, NP     PDMP not reviewed this encounter.   Mickie Bail, NP 06/30/20 1317

## 2020-06-30 NOTE — Discharge Instructions (Addendum)
Your urine is normal.  Your urine pregnancy is negative.    Take metronidazole and fluconazole as directed.     Your vaginal tests are pending.  If your test results are positive, we will call you.  You and your sexual partner(s) may require treatment at that time.  Do not have sexual activity until the test results are back.  Your blood pressure is elevated today at 172/121.  Please have this rechecked by your primary care provider in next week.

## 2020-07-01 LAB — CERVICOVAGINAL ANCILLARY ONLY
Bacterial Vaginitis (gardnerella): POSITIVE — AB
Candida Glabrata: NEGATIVE
Candida Vaginitis: POSITIVE — AB
Chlamydia: NEGATIVE
Comment: NEGATIVE
Comment: NEGATIVE
Comment: NEGATIVE
Comment: NEGATIVE
Comment: NEGATIVE
Comment: NORMAL
Neisseria Gonorrhea: NEGATIVE
Trichomonas: NEGATIVE

## 2021-01-05 ENCOUNTER — Ambulatory Visit (HOSPITAL_COMMUNITY)
Admission: EM | Admit: 2021-01-05 | Discharge: 2021-01-05 | Disposition: A | Discharge status: Home / Self Care | Payer: 59

## 2021-08-20 ENCOUNTER — Encounter (HOSPITAL_COMMUNITY): Payer: Self-pay | Admitting: Emergency Medicine

## 2021-08-20 ENCOUNTER — Ambulatory Visit (HOSPITAL_COMMUNITY)
Admission: EM | Admit: 2021-08-20 | Discharge: 2021-08-20 | Disposition: A | Discharge status: Home / Self Care | Payer: 59 | Attending: Physician Assistant | Admitting: Physician Assistant

## 2021-08-20 ENCOUNTER — Other Ambulatory Visit: Payer: Self-pay

## 2021-08-20 DIAGNOSIS — R42 Dizziness and giddiness: Secondary | ICD-10-CM | POA: Diagnosis not present

## 2021-08-20 DIAGNOSIS — G44209 Tension-type headache, unspecified, not intractable: Secondary | ICD-10-CM

## 2021-08-20 HISTORY — DX: Migraine, unspecified, not intractable, without status migrainosus: G43.909

## 2021-08-20 LAB — POCT URINALYSIS DIPSTICK, ED / UC
Bilirubin Urine: NEGATIVE
Glucose, UA: NEGATIVE mg/dL
Ketones, ur: NEGATIVE mg/dL
Leukocytes,Ua: NEGATIVE
Nitrite: NEGATIVE
Protein, ur: NEGATIVE mg/dL
Specific Gravity, Urine: 1.02 (ref 1.005–1.030)
Urobilinogen, UA: 0.2 mg/dL (ref 0.0–1.0)
pH: 6 (ref 5.0–8.0)

## 2021-08-20 LAB — POC URINE PREG, ED: Preg Test, Ur: NEGATIVE

## 2021-08-20 LAB — CBG MONITORING, ED: Glucose-Capillary: 91 mg/dL (ref 70–99)

## 2021-08-20 MED ORDER — ONDANSETRON 4 MG PO TBDP: 4.0000 mg | ORAL_TABLET | Freq: Three times a day (TID) | ORAL | 0 refills | Status: DC | PRN

## 2021-08-20 MED ORDER — ONDANSETRON 4 MG PO TBDP
ORAL_TABLET | ORAL | Status: AC
  Filled 2021-08-20: qty 1

## 2021-08-20 MED ORDER — KETOROLAC TROMETHAMINE 60 MG/2ML IM SOLN
INTRAMUSCULAR | Status: AC
  Filled 2021-08-20: qty 2

## 2021-08-20 MED ORDER — MECLIZINE HCL 25 MG PO TABS: 25.0000 mg | ORAL_TABLET | Freq: Three times a day (TID) | ORAL | 0 refills | Status: DC | PRN

## 2021-08-20 MED ORDER — ONDANSETRON 4 MG PO TBDP
4.0000 mg | ORAL_TABLET | Freq: Once | ORAL | Status: AC
  Administered 2021-08-20: 4 mg via ORAL

## 2021-08-20 MED ORDER — KETOROLAC TROMETHAMINE 60 MG/2ML IM SOLN
60.0000 mg | Freq: Once | INTRAMUSCULAR | Status: AC
  Administered 2021-08-20: 60 mg via INTRAMUSCULAR

## 2021-08-20 NOTE — Discharge Instructions (Signed)
You are given an injection of Toradol today.  Please avoid NSAIDs for 8 to 12 hours including aspirin, ibuprofen/Advil, naproxen/Aleve.  You can use Tylenol for breakthrough pain.  Use Zofran every 8 hours as needed for nausea and vomiting.  Use meclizine for dizziness up to 3 times a day.  Make sure you rest and drink plenty of fluid.  Eat small frequent meals.  If your symptoms are not improving or if at any point anything worsens and you develop headache, nausea/vomiting interfering with oral intake, weakness, feeling as though you are going to pass out you need to be seen immediately.

## 2021-08-20 NOTE — ED Provider Notes (Signed)
[facility]    CSN: [id number] Arrival date & time: 08/20/21  1938      History   Chief Complaint Chief Complaint  Patient presents with   Migraine   Dizziness    HPI Jillian Rowland is a 36 y.o. female.   Patient presents today with a 1 day history of headache and dizziness.  Patient describes sensation of room spinning/unsteadiness; denies any near-syncope or syncopal episodes.  Symptoms are worse with moving her gaze or head.  She denies any recent medication changes, recent illness or additional symptoms including cough/congestion, recent head injury.  Pain is rated 8 on a 0-10 pain scale, localized to bitemporal region with radiation around head, described as throbbing/tension, no alleviating factors identified.  She does have a history of migraines but reports that symptoms are similar with typical migraine but she has never experienced dizziness.  She reports drinking normally but has had decreased appetite.  She reports associated nausea but denies any vomiting, vision changes, focal weakness, dysarthria.  She is on Topamax for migraine prevention and uses Excedrin Migraine for abortive therapy but reports this has been ineffective.   Past Medical History:  Diagnosis Date   Abnormal Pap smear    Bacterial vaginal infection    Bacterial vaginitis 10/24/2017   Chlamydia    Gonorrhea    Hypertension    Migraine    Pollen allergy    Yeast vaginitis     Patient Active Problem List   Diagnosis Date Noted   Bacterial vaginitis 10/24/2017   Chlamydia 08/18/2016    Past Surgical History:  Procedure Laterality Date   CESAREAN SECTION  2009   x 1    OB History     Gravida  2   Para  1   Term  1   Preterm      AB  1   Living  1      SAB  1   IAB      Ectopic      Multiple      Live Births  1            Home Medications    Prior to Admission medications   Medication Sig Start Date End Date Taking? Authorizing Provider   meclizine (ANTIVERT) 25 MG tablet Take 1 tablet (25 mg total) by mouth 3 (three) times daily as needed for dizziness. 08/20/21  Yes Ladena Jacquez K, PA-C  ondansetron (ZOFRAN-ODT) 4 MG disintegrating tablet Take 1 tablet (4 mg total) by mouth every 8 (eight) hours as needed for nausea or vomiting. 08/20/21  Yes Kemp Gomes, Derry Skill, PA-C  acetaminophen (TYLENOL) 500 MG tablet Take 500-1,000 mg by mouth every 6 (six) hours as needed.     [provider]  aspirin-acetaminophen-caffeine (EXCEDRIN MIGRAINE) [phone] MG tablet Take 1-2 tablets by mouth every 6 (six) hours as needed for headache.     [provider]  cetirizine (ZYRTEC ALLERGY) 10 MG tablet Zyrtec 10 mg tablet  Take 1 tablet every day by oral route. 01/14/16   [provider]  fluconazole (DIFLUCAN) 150 MG tablet Take 1 tablet (150 mg total) by mouth daily. Take one tablet today.  May repeat in 3 days. 06/30/20   Sharion Balloon, NP  losartan (COZAAR) 25 MG tablet Take 25 mg by mouth daily.    [provider]  metroNIDAZOLE (FLAGYL) 500 MG tablet Take 1 tablet (500 mg total) by mouth 2 (two) times daily. 06/30/20   Hall Busing,  Fredderick Phenix, NP  VALTREX 1 g tablet Take 1 tablet (1,000 mg total) by mouth 2 (two) times daily. 03/06/17 03/13/17  Starr Lake, CNM    Family History Family History  Problem Relation Age of Onset   Asthma Mother    Diabetes Father    Hypertension Father     Social History Social History   Tobacco Use   Smoking status: Every Day    Packs/day: 0.25    Years: 3.00    Pack years: 0.75    Types: Cigarettes   Smokeless tobacco: Never  Substance Use Topics   Alcohol use: Yes    Comment: Occasional   Drug use: No    Comment: none     Allergies   Latex   Review of Systems Review of Systems  Constitutional:  Positive for activity change. Negative for appetite change, fatigue and fever.  HENT:  Negative for congestion, sinus pressure, sneezing and sore throat.   Eyes:   Negative for photophobia and visual disturbance.  Respiratory:  Negative for cough and shortness of breath.   Cardiovascular:  Negative for chest pain.  Gastrointestinal:  Positive for nausea. Negative for abdominal pain, diarrhea and vomiting.  Musculoskeletal:  Negative for arthralgias and myalgias.  Neurological:  Positive for dizziness and headaches. Negative for seizures, facial asymmetry, speech difficulty, weakness and light-headedness.    Physical Exam Triage Vital Signs ED Triage Vitals  Enc Vitals Group     BP 08/20/21 1948 (!) 134/93     Pulse Rate 08/20/21 1948 67     Resp 08/20/21 1948 16     Temp 08/20/21 1948 98.1 F (36.7 C)     Temp Source 08/20/21 1948 Oral     SpO2 08/20/21 1948 97 %     Weight --      Height --      Head Circumference --      Peak Flow --      Pain Score 08/20/21 1945 8     Pain Loc --      Pain Edu? --      Excl. in [place]? --    No data found.  Updated Vital Signs BP (!) 134/93 (BP Location: Right Arm)    Pulse 67    Temp 98.1 F (36.7 C) (Oral)    Resp 16    LMP 07/02/2021    SpO2 97%   Visual Acuity Right Eye Distance:   Left Eye Distance:   Bilateral Distance:    Right Eye Near:   Left Eye Near:    Bilateral Near:     Physical Exam Vitals reviewed.  Constitutional:      General: She is awake. She is not in acute distress.    Appearance: Normal appearance. She is well-developed. She is not ill-appearing.     Comments: Very pleasant female appears stated age in no acute distress sitting comfortably in exam room  HENT:     Head: Normocephalic and atraumatic. No raccoon eyes, Battle's sign or contusion.     Right Ear: Tympanic membrane, ear canal and external ear normal. No hemotympanum.     Left Ear: Tympanic membrane, ear canal and external ear normal. No hemotympanum.     Mouth/Throat:     Tongue: Tongue does not deviate from midline.     Pharynx: Uvula midline. No oropharyngeal exudate or posterior oropharyngeal erythema.   Eyes:     Extraocular Movements: Extraocular movements intact.     Pupils: Pupils are equal,  round, and reactive to light.  Cardiovascular:     Rate and Rhythm: Normal rate and regular rhythm.     Heart sounds: Normal heart sounds, S1 normal and S2 normal. No murmur heard. Pulmonary:     Effort: Pulmonary effort is normal.     Breath sounds: Normal breath sounds. No wheezing, rhonchi or rales.     Comments: Clear to auscultation bilaterally Abdominal:     General: Bowel sounds are normal.     Palpations: Abdomen is soft.     Tenderness: There is no abdominal tenderness.  Musculoskeletal:     Cervical back: No spinous process tenderness or muscular tenderness.     Comments: Strength 5/5 bilateral upper and lower extremities  Lymphadenopathy:     Head:     Right side of head: No submental, submandibular or tonsillar adenopathy.     Left side of head: No submental, submandibular or tonsillar adenopathy.  Neurological:     General: No focal deficit present.     Cranial Nerves: Cranial nerves 2-12 are intact.     Motor: Motor function is intact.     Coordination: Coordination is intact.     Gait: Gait is intact.  Psychiatric:        Behavior: Behavior is cooperative.     UC Treatments / Results  Labs (all labs ordered are listed, but only abnormal results are displayed) Labs Reviewed  POCT URINALYSIS DIPSTICK, ED / UC - Abnormal; Notable for the following components:      Result Value   Hgb urine dipstick TRACE (*)    All other components within normal limits  POC URINE PREG, ED  CBG MONITORING, ED    EKG   Radiology No results found.  Procedures Procedures (including critical care time)  Medications Ordered in UC Medications  ondansetron (ZOFRAN-ODT) disintegrating tablet 4 mg (4 mg Oral Given 08/20/21 2004)  ketorolac (TORADOL) injection 60 mg (60 mg Intramuscular Given 08/20/21 2004)    Initial Impression / Assessment and Plan / UC Course  I have reviewed the  triage vital signs and the nursing notes.  Pertinent labs & imaging results that were available during my care of the patient were reviewed by me and considered in my medical decision making (see chart for details).     Vital signs and physical exam reassuring today; no indication for emergent evaluation or imaging.  Patient did have some improvement with Toradol and Zofran in clinic but not resolution of symptoms.  Symptoms are consistent with BPPV versus other vertigo; discussed that if she develops any lightheadedness she needs to return for reevaluation.  Recommend she avoid NSAIDs for an additional 12 hours but can use Tylenol for additional pain relief.  She was given meclizine as well as prescription for Zofran for additional symptom management.  UA was normal.  Urine pregnancy was negative.  Glucose was appropriate.  She was encouraged to rest and drink plenty of fluid.  Recommended she eat small frequent meals.  She was given work excuse note.  Discussed that if symptoms are not improving overnight, if at any point anything worsens, or she develops severe headache, nausea/vomiting interfering with oral intake, lightheadedness, chest pain, shortness of breath she is to be seen immediately.  Discussed alarm symptoms that warrant going directly to the emergency room.  Strict return precautions given to which she expressed understanding.  Final Clinical Impressions(s) / UC Diagnoses   Final diagnoses:  Tension-type headache, not intractable, unspecified chronicity pattern  Dizziness  Discharge Instructions      You are given an injection of Toradol today.  Please avoid NSAIDs for 8 to 12 hours including aspirin, ibuprofen/Advil, naproxen/Aleve.  You can use Tylenol for breakthrough pain.  Use Zofran every 8 hours as needed for nausea and vomiting.  Use meclizine for dizziness up to 3 times a day.  Make sure you rest and drink plenty of fluid.  Eat small frequent meals.  If your symptoms  are not improving or if at any point anything worsens and you develop headache, nausea/vomiting interfering with oral intake, weakness, feeling as though you are going to pass out you need to be seen immediately.     ED Prescriptions     Medication Sig Dispense Auth. Provider   meclizine (ANTIVERT) 25 MG tablet Take 1 tablet (25 mg total) by mouth 3 (three) times daily as needed for dizziness. 30 tablet Janayla Marik K, PA-C   ondansetron (ZOFRAN-ODT) 4 MG disintegrating tablet Take 1 tablet (4 mg total) by mouth every 8 (eight) hours as needed for nausea or vomiting. 20 tablet Kimbella Heisler, Derry Skill, PA-C      PDMP not reviewed this encounter.   Terrilee Croak, PA-C 08/20/21 2019

## 2021-08-20 NOTE — ED Triage Notes (Signed)
Pt c/o migraine since last night with nausea. Today having dizziness. Reports taking HTN meds but doesn't have a machine to monitor her BP. Reports has migraine meds but not helping.

## 2022-01-09 ENCOUNTER — Emergency Department (HOSPITAL_BASED_OUTPATIENT_CLINIC_OR_DEPARTMENT_OTHER)
Admission: EM | Admit: 2022-01-09 | Discharge: 2022-01-09 | Disposition: A | Discharge status: Home / Self Care | Payer: Medicaid Other | Attending: Emergency Medicine | Admitting: Emergency Medicine

## 2022-01-09 ENCOUNTER — Other Ambulatory Visit: Payer: Self-pay

## 2022-01-09 ENCOUNTER — Emergency Department (HOSPITAL_BASED_OUTPATIENT_CLINIC_OR_DEPARTMENT_OTHER): Payer: Medicaid Other | Admitting: Radiology

## 2022-01-09 ENCOUNTER — Encounter (HOSPITAL_BASED_OUTPATIENT_CLINIC_OR_DEPARTMENT_OTHER): Payer: Self-pay | Admitting: Emergency Medicine

## 2022-01-09 DIAGNOSIS — Z7982 Long term (current) use of aspirin: Secondary | ICD-10-CM | POA: Diagnosis not present

## 2022-01-09 DIAGNOSIS — S4991XA Unspecified injury of right shoulder and upper arm, initial encounter: Secondary | ICD-10-CM | POA: Diagnosis present

## 2022-01-09 DIAGNOSIS — M25552 Pain in left hip: Secondary | ICD-10-CM | POA: Diagnosis not present

## 2022-01-09 DIAGNOSIS — Y92410 Unspecified street and highway as the place of occurrence of the external cause: Secondary | ICD-10-CM | POA: Diagnosis not present

## 2022-01-09 DIAGNOSIS — R519 Headache, unspecified: Secondary | ICD-10-CM | POA: Insufficient documentation

## 2022-01-09 DIAGNOSIS — Z91040 Latex allergy status: Secondary | ICD-10-CM | POA: Diagnosis not present

## 2022-01-09 DIAGNOSIS — Z79899 Other long term (current) drug therapy: Secondary | ICD-10-CM | POA: Diagnosis not present

## 2022-01-09 DIAGNOSIS — S46911A Strain of unspecified muscle, fascia and tendon at shoulder and upper arm level, right arm, initial encounter: Secondary | ICD-10-CM | POA: Insufficient documentation

## 2022-01-09 DIAGNOSIS — T148XXA Other injury of unspecified body region, initial encounter: Secondary | ICD-10-CM

## 2022-01-09 MED ORDER — METHOCARBAMOL 500 MG PO TABS: 500.0000 mg | ORAL_TABLET | Freq: Two times a day (BID) | ORAL | 0 refills | Status: DC

## 2022-01-09 MED ORDER — OXYCODONE-ACETAMINOPHEN 5-325 MG PO TABS
2.0000 | ORAL_TABLET | Freq: Once | ORAL | Status: AC
  Administered 2022-01-09: 2 via ORAL
  Filled 2022-01-09: qty 2

## 2022-01-09 MED ORDER — DIAZEPAM 5 MG PO TABS
5.0000 mg | ORAL_TABLET | Freq: Once | ORAL | Status: AC
  Administered 2022-01-09: 5 mg via ORAL
  Filled 2022-01-09: qty 1

## 2022-01-09 MED ORDER — OXYCODONE-ACETAMINOPHEN 5-325 MG PO TABS: 1.0000 | ORAL_TABLET | Freq: Four times a day (QID) | ORAL | 0 refills | Status: DC | PRN

## 2022-01-09 NOTE — ED Notes (Signed)
Pt verbalizes understanding of discharge instructions. Opportunity for questioning and answers were provided. Pt discharged from ED to home.   ? ?
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
## 2022-02-21 ENCOUNTER — Emergency Department (HOSPITAL_BASED_OUTPATIENT_CLINIC_OR_DEPARTMENT_OTHER)
Admission: EM | Admit: 2022-02-21 | Discharge: 2022-02-21 | Discharge status: Absent without leave | Payer: Medicaid Other | Source: Home / Self Care

## 2022-03-29 ENCOUNTER — Emergency Department (HOSPITAL_COMMUNITY): Payer: Medicaid Other

## 2022-03-29 ENCOUNTER — Encounter (HOSPITAL_COMMUNITY): Payer: Self-pay

## 2022-03-29 ENCOUNTER — Other Ambulatory Visit: Payer: Self-pay

## 2022-03-29 ENCOUNTER — Emergency Department (HOSPITAL_COMMUNITY)
Admission: EM | Admit: 2022-03-29 | Discharge: 2022-03-29 | Disposition: A | Discharge status: Home / Self Care | Payer: Medicaid Other | Attending: Emergency Medicine | Admitting: Emergency Medicine

## 2022-03-29 DIAGNOSIS — R0789 Other chest pain: Secondary | ICD-10-CM | POA: Insufficient documentation

## 2022-03-29 DIAGNOSIS — Z91040 Latex allergy status: Secondary | ICD-10-CM | POA: Diagnosis not present

## 2022-03-29 DIAGNOSIS — U070 Vaping-related disorder: Secondary | ICD-10-CM | POA: Diagnosis not present

## 2022-03-29 DIAGNOSIS — I10 Essential (primary) hypertension: Secondary | ICD-10-CM | POA: Diagnosis not present

## 2022-03-29 DIAGNOSIS — Z79899 Other long term (current) drug therapy: Secondary | ICD-10-CM | POA: Insufficient documentation

## 2022-03-29 DIAGNOSIS — Z7289 Other problems related to lifestyle: Secondary | ICD-10-CM

## 2022-03-29 LAB — COMPREHENSIVE METABOLIC PANEL
ALT: 31 U/L (ref 0–44)
AST: 16 U/L (ref 15–41)
Albumin: 3.6 g/dL (ref 3.5–5.0)
Alkaline Phosphatase: 60 U/L (ref 38–126)
Anion gap: 5 (ref 5–15)
BUN: 12 mg/dL (ref 6–20)
CO2: 25 mmol/L (ref 22–32)
Calcium: 8.7 mg/dL — ABNORMAL LOW (ref 8.9–10.3)
Chloride: 108 mmol/L (ref 98–111)
Creatinine, Ser: 0.5 mg/dL (ref 0.44–1.00)
GFR, Estimated: >60 mL/min (ref >60)
Glucose, Bld: 99 mg/dL (ref 70–99)
Potassium: 3.8 mmol/L (ref 3.5–5.1)
Sodium: 138 mmol/L (ref 135–145)
Total Bilirubin: 0.8 mg/dL (ref 0.3–1.2)
Total Protein: 7.3 g/dL (ref 6.5–8.1)

## 2022-03-29 LAB — CBC WITH DIFFERENTIAL/PLATELET
Abs Immature Granulocytes: 0.02 10*3/uL (ref 0.00–0.07)
Basophils Absolute: 0 10*3/uL (ref 0.0–0.1)
Basophils Relative: 1 %
Eosinophils Absolute: 0.1 10*3/uL (ref 0.0–0.5)
Eosinophils Relative: 1 %
HCT: 41.1 % (ref 36.0–46.0)
Hemoglobin: 13.7 g/dL (ref 12.0–15.0)
Immature Granulocytes: 0 %
Lymphocytes Relative: 27 %
Lymphs Abs: 1.7 10*3/uL (ref 0.7–4.0)
MCH: 28.2 pg (ref 26.0–34.0)
MCHC: 33.3 g/dL (ref 30.0–36.0)
MCV: 84.6 fL (ref 80.0–100.0)
Monocytes Absolute: 0.5 10*3/uL (ref 0.1–1.0)
Monocytes Relative: 7 %
Neutro Abs: 4 10*3/uL (ref 1.7–7.7)
Neutrophils Relative %: 64 %
Platelets: 339 10*3/uL (ref 150–400)
RBC: 4.86 MIL/uL (ref 3.87–5.11)
RDW: 12.7 % (ref 11.5–15.5)
WBC: 6.3 10*3/uL (ref 4.0–10.5)
nRBC: 0 % (ref 0.0–0.2)

## 2022-03-29 LAB — I-STAT BETA HCG BLOOD, ED (MC, WL, AP ONLY): I-stat hCG, quantitative: <5 m[IU]/mL (ref <5)

## 2022-03-29 LAB — LIPASE, BLOOD: Lipase: 27 U/L (ref 11–51)

## 2022-03-29 LAB — TROPONIN I (HIGH SENSITIVITY): Troponin I (High Sensitivity): 3 ng/L (ref <18)

## 2022-03-29 MED ORDER — IBUPROFEN 600 MG PO TABS: 600.0000 mg | ORAL_TABLET | Freq: Three times a day (TID) | ORAL | 0 refills | Status: DC | PRN

## 2022-03-29 MED ORDER — KETOROLAC TROMETHAMINE 60 MG/2ML IM SOLN
30.0000 mg | Freq: Once | INTRAMUSCULAR | Status: AC
  Administered 2022-03-29: 30 mg via INTRAMUSCULAR
  Filled 2022-03-29: qty 2

## 2022-03-29 MED ORDER — LIDOCAINE 5 % EX PTCH: 1.0000 | MEDICATED_PATCH | CUTANEOUS | 0 refills | Status: DC

## 2022-03-29 MED ORDER — LIDOCAINE 5 % EX PTCH
1.0000 | MEDICATED_PATCH | CUTANEOUS | Status: DC
  Administered 2022-03-29: 1 via TRANSDERMAL
  Filled 2022-03-29: qty 1
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
## 2022-03-29 NOTE — ED Provider Triage Note (Signed)
Emergency Medicine Provider Triage Evaluation Note  Jillian Rowland , a 36 y.o. female  was evaluated in triage.  Pt complains of right sided chest pain since smoking a vape yesterday. Reports better today. Worse when she smokes a vape. Better with lifting up her right breast. Denies any vomiting, reports some nausea.  Review of Systems  Positive:  Negative:   Physical Exam  BP (!) 140/100   Pulse 92   Temp 99.2 F (37.3 C) (Oral)   Resp 17   Ht 5\' 5"  (1.651 m)   Wt 110.2 kg   SpO2 98%   BMI 40.44 kg/m  Gen:   Awake, no distress   Resp:  Normal effort  MSK:   Moves extremities without difficulty  Other:  Tender to palpation. RRR  Medical Decision Making  Medically screening exam initiated at 3:08 PM.  Appropriate orders placed.  Jillian Rowland was informed that the remainder of the evaluation will be completed by another provider, this initial triage assessment does not replace that evaluation, and the importance of remaining in the ED until their evaluation is complete.  Likely chest wall tenderness. Will order labs and CXR for covereage   Carrington Clamp, Achille Rich 03/29/22 1511

## 2022-03-29 NOTE — ED Notes (Addendum)
Patient and MD made aware of her BP. Patient refused further care. Patient left before covid swab.

## 2022-03-29 NOTE — Discharge Instructions (Addendum)
Recommend 600mg  Ibuprofen every 8 hrs and lidocaine patches for right sided chest wall pain.

## 2022-03-29 NOTE — ED Triage Notes (Signed)
Pt reports intermittent chest pain and dizziness since yesterday. Pt denies any other associated symptoms. States the pain improves after raising her right breast and gets worse when she vapes.

## 2022-05-05 ENCOUNTER — Encounter: Payer: Self-pay | Admitting: Internal Medicine

## 2022-05-05 ENCOUNTER — Ambulatory Visit (INDEPENDENT_AMBULATORY_CARE_PROVIDER_SITE_OTHER): Payer: Medicaid Other | Admitting: Internal Medicine

## 2022-05-05 DIAGNOSIS — I10 Essential (primary) hypertension: Secondary | ICD-10-CM

## 2022-05-05 DIAGNOSIS — N912 Amenorrhea, unspecified: Secondary | ICD-10-CM | POA: Insufficient documentation

## 2022-05-05 DIAGNOSIS — Z23 Encounter for immunization: Secondary | ICD-10-CM

## 2022-05-05 DIAGNOSIS — R7989 Other specified abnormal findings of blood chemistry: Secondary | ICD-10-CM

## 2022-05-05 DIAGNOSIS — L989 Disorder of the skin and subcutaneous tissue, unspecified: Secondary | ICD-10-CM | POA: Insufficient documentation

## 2022-05-05 LAB — HCG, QUANTITATIVE, PREGNANCY: Quantitative HCG: <0.6 m[IU]/mL

## 2022-05-05 LAB — TSH: TSH: 0.33 u[IU]/mL — ABNORMAL LOW (ref 0.35–5.50)

## 2022-05-05 LAB — HEMOGLOBIN A1C: Hgb A1c MFr Bld: 5.5 % (ref 4.6–6.5)

## 2022-05-05 MED ORDER — LABETALOL HCL 100 MG PO TABS: 100.0000 mg | ORAL_TABLET | Freq: Two times a day (BID) | ORAL | 1 refills | Status: DC

## 2022-05-05 NOTE — Progress Notes (Unsigned)
Subjective:  Patient ID: Jillian Rowland, female    DOB: [birth year]  Age: 36 y.o. MRN: [id number]  CC: Hypertension   HPI Jillian Rowland presents for establishing -  HTN -  Skin lesion right face for 5 months -  History Jillian Rowland has a past medical history of Abnormal Pap smear, Bacterial vaginal infection, Bacterial vaginitis (10/24/2017), Chlamydia, Gonorrhea, Hypertension, Migraine, Pollen allergy, and Yeast vaginitis.   She has a past surgical history that includes Cesarean section (2009).   Her family history includes Asthma in her mother; Diabetes in her father; Hypertension in her father.She reports that she has been smoking cigarettes. She has a 0.75 pack-year smoking history. She has never used smokeless tobacco. She reports current alcohol use. She reports that she does not use drugs.  Outpatient Medications Prior to Visit  Medication Sig Dispense Refill   aspirin-acetaminophen-caffeine (EXCEDRIN MIGRAINE) 250-250-65 MG tablet Take 1-2 tablets by mouth every 6 (six) hours as needed for headache.      VALTREX 1 g tablet Take 1 tablet (1,000 mg total) by mouth 2 (two) times daily. 14 tablet 3   acetaminophen (TYLENOL) 500 MG tablet Take 500-1,000 mg by mouth every 6 (six) hours as needed.  (Patient not taking: Reported on 05/05/2022)     cetirizine (ZYRTEC ALLERGY) 10 MG tablet Zyrtec 10 mg tablet  Take 1 tablet every day by oral route. (Patient not taking: Reported on 05/05/2022)     fluconazole (DIFLUCAN) 150 MG tablet Take 1 tablet (150 mg total) by mouth daily. Take one tablet today.  May repeat in 3 days. (Patient not taking: Reported on 05/05/2022) 2 tablet 0   ibuprofen (ADVIL) 600 MG tablet Take 1 tablet (600 mg total) by mouth every 8 (eight) hours as needed. (Patient not taking: Reported on 05/05/2022) 30 tablet 0   labetalol (NORMODYNE) 100 MG tablet Take 100 mg by mouth daily. (Patient not taking: Reported on 05/05/2022)     lidocaine (LIDODERM) 5 % Place 1 patch  onto the skin daily. Remove & Discard patch within 12 hours or as directed by MD (Patient not taking: Reported on 05/05/2022) 30 patch 0   losartan (COZAAR) 25 MG tablet Take 25 mg by mouth daily. (Patient not taking: Reported on 05/05/2022)     meclizine (ANTIVERT) 25 MG tablet Take 1 tablet (25 mg total) by mouth 3 (three) times daily as needed for dizziness. (Patient not taking: Reported on 05/05/2022) 30 tablet 0   methocarbamol (ROBAXIN) 500 MG tablet Take 1 tablet (500 mg total) by mouth 2 (two) times daily. (Patient not taking: Reported on 05/05/2022) 20 tablet 0   metroNIDAZOLE (FLAGYL) 500 MG tablet Take 1 tablet (500 mg total) by mouth 2 (two) times daily. (Patient not taking: Reported on 05/05/2022) 14 tablet 0   ondansetron (ZOFRAN-ODT) 4 MG disintegrating tablet Take 1 tablet (4 mg total) by mouth every 8 (eight) hours as needed for nausea or vomiting. (Patient not taking: Reported on 05/05/2022) 20 tablet 0   oxyCODONE-acetaminophen (PERCOCET/ROXICET) 5-325 MG tablet Take 1-2 tablets by mouth every 6 (six) hours as needed for severe pain. (Patient not taking: Reported on 05/05/2022) 15 tablet 0   No facility-administered medications prior to visit.    ROS Review of Systems  Objective:  BP 138/80   Pulse 84   Temp 98.7 F (37.1 C) (Oral)   Wt 242 lb (109.8 kg)   LMP 02/02/2022 (Exact Date)   SpO2 98%   BMI 40.27 kg/m  Physical Exam  Lab Results  Component Value Date   WBC 6.3 03/29/2022   HGB 13.7 03/29/2022   HCT 41.1 03/29/2022   PLT 339 03/29/2022   GLUCOSE 99 03/29/2022   ALT 31 03/29/2022   AST 16 03/29/2022   NA 138 03/29/2022   K 3.8 03/29/2022   CL 108 03/29/2022   CREATININE 0.50 03/29/2022   BUN 12 03/29/2022   CO2 25 03/29/2022   TSH 0.33 (L) 05/05/2022   HGBA1C 5.5 05/05/2022     Assessment & Plan:   Jillian Rowland was seen today for hypertension.  Diagnoses and all orders for this visit:  Morbid obesity (Jillian Rowland) -     TSH; Future -     Hemoglobin A1c;  Future -     Hemoglobin A1c -     TSH  Amenorrhea -     TSH; Future -     hCG, quantitative, pregnancy; Future -     hCG, quantitative, pregnancy -     TSH  Primary hypertension -     labetalol (NORMODYNE) 100 MG tablet; Take 1 tablet (100 mg total) by mouth 2 (two) times daily.  Low TSH level -     Ambulatory referral to Endocrinology  Skin lesion of face -     Ambulatory referral to Dermatology  Other orders -     Flu Vaccine QUAD 6+ mos PF IM (Fluarix Quad PF)   I have discontinued Jillian Rowland's acetaminophen, aspirin-acetaminophen-caffeine, losartan, cetirizine, fluconazole, metroNIDAZOLE, meclizine, ondansetron, oxyCODONE-acetaminophen, methocarbamol, lidocaine, and ibuprofen. I have also changed her labetalol. Additionally, I am having her maintain her Valtrex.  Meds ordered this encounter  Medications   labetalol (NORMODYNE) 100 MG tablet    Sig: Take 1 tablet (100 mg total) by mouth 2 (two) times daily.    Dispense:  180 tablet    Refill:  1     Follow-up: Return in about 3 months (around 08/04/2022).   Jillian Calico, MD

## 2022-05-05 NOTE — Patient Instructions (Signed)
Hypertension, Adult High blood pressure (hypertension) is when the force of blood pumping through the arteries is too strong. The arteries are the blood vessels that carry blood from the heart throughout the body. Hypertension forces the heart to work harder to pump blood and may cause arteries to become narrow or stiff. Untreated or uncontrolled hypertension can lead to a heart attack, heart failure, a stroke, kidney disease, and other problems. A blood pressure reading consists of a higher number over a lower number. Ideally, your blood pressure should be below 120/80. The first ("top") number is called the systolic pressure. It is a measure of the pressure in your arteries as your heart beats. The second ("bottom") number is called the diastolic pressure. It is a measure of the pressure in your arteries as the heart relaxes. What are the causes? The exact cause of this condition is not known. There are some conditions that result in high blood pressure. What increases the risk? Certain factors may make you more likely to develop high blood pressure. Some of these risk factors are under your control, including: Smoking. Not getting enough exercise or physical activity. Being overweight. Having too much fat, sugar, calories, or salt (sodium) in your diet. Drinking too much alcohol. Other risk factors include: Having a personal history of heart disease, diabetes, high cholesterol, or kidney disease. Stress. Having a family history of high blood pressure and high cholesterol. Having obstructive sleep apnea. Age. The risk increases with age. What are the signs or symptoms? High blood pressure may not cause symptoms. Very high blood pressure (hypertensive crisis) may cause: Headache. Fast or irregular heartbeats (palpitations). Shortness of breath. Nosebleed. Nausea and vomiting. Vision changes. Severe chest pain, dizziness, and seizures. How is this diagnosed? This condition is diagnosed by  measuring your blood pressure while you are seated, with your arm resting on a flat surface, your legs uncrossed, and your feet flat on the floor. The cuff of the blood pressure monitor will be placed directly against the skin of your upper arm at the level of your heart. Blood pressure should be measured at least twice using the same arm. Certain conditions can cause a difference in blood pressure between your right and left arms. If you have a high blood pressure reading during one visit or you have normal blood pressure with other risk factors, you may be asked to: Return on a different day to have your blood pressure checked again. Monitor your blood pressure at home for 1 week or longer. If you are diagnosed with hypertension, you may have other blood or imaging tests to help your health care provider understand your overall risk for other conditions. How is this treated? This condition is treated by making healthy lifestyle changes, such as eating healthy foods, exercising more, and reducing your alcohol intake. You may be referred for counseling on a healthy diet and physical activity. Your health care provider may prescribe medicine if lifestyle changes are not enough to get your blood pressure under control and if: Your systolic blood pressure is above 130. Your diastolic blood pressure is above 80. Your personal target blood pressure may vary depending on your medical conditions, your age, and other factors. Follow these instructions at home: Eating and drinking  Eat a diet that is high in fiber and potassium, and low in sodium, added sugar, and fat. An example of this eating plan is called the DASH diet. DASH stands for Dietary Approaches to Stop Hypertension. To eat this way: Eat   plenty of fresh fruits and vegetables. Try to fill one half of your plate at each meal with fruits and vegetables. Eat whole grains, such as whole-wheat pasta, brown rice, or whole-grain bread. Fill about one  fourth of your plate with whole grains. Eat or drink low-fat dairy products, such as skim milk or low-fat yogurt. Avoid fatty cuts of meat, processed or cured meats, and poultry with skin. Fill about one fourth of your plate with lean proteins, such as fish, chicken without skin, beans, eggs, or tofu. Avoid pre-made and processed foods. These tend to be higher in sodium, added sugar, and fat. Reduce your daily sodium intake. Many people with hypertension should eat less than 1,500 mg of sodium a day. Do not drink alcohol if: Your health care provider tells you not to drink. You are pregnant, may be pregnant, or are planning to become pregnant. If you drink alcohol: Limit how much you have to: 0-1 drink a day for women. 0-2 drinks a day for men. Know how much alcohol is in your drink. In the [place] one drink equals one 12 oz bottle of beer (355 mL), one 5 oz glass of wine (148 mL), or one 1 oz glass of hard liquor (44 mL). Lifestyle  Work with your health care provider to maintain a healthy body weight or to lose weight. Ask what an ideal weight is for you. Get at least 30 minutes of exercise that causes your heart to beat faster (aerobic exercise) most days of the week. Activities may include walking, swimming, or biking. Include exercise to strengthen your muscles (resistance exercise), such as Pilates or lifting weights, as part of your weekly exercise routine. Try to do these types of exercises for 30 minutes at least 3 days a week. Do not use any products that contain nicotine or tobacco. These products include cigarettes, chewing tobacco, and vaping devices, such as e-cigarettes. If you need help quitting, ask your health care provider. Monitor your blood pressure at home as told by your health care provider. Keep all follow-up visits. This is important. Medicines Take over-the-counter and prescription medicines only as told by your health care provider. Follow directions carefully. Blood  pressure medicines must be taken as prescribed. Do not skip doses of blood pressure medicine. Doing this puts you at risk for problems and can make the medicine less effective. Ask your health care provider about side effects or reactions to medicines that you should watch for. Contact a health care provider if you: Think you are having a reaction to a medicine you are taking. Have headaches that keep coming back (recurring). Feel dizzy. Have swelling in your ankles. Have trouble with your vision. Get help right away if you: Develop a severe headache or confusion. Have unusual weakness or numbness. Feel faint. Have severe pain in your chest or abdomen. Vomit repeatedly. Have trouble breathing. These symptoms may be an emergency. Get help right away. Call 911. Do not wait to see if the symptoms will go away. Do not drive yourself to the hospital. Summary Hypertension is when the force of blood pumping through your arteries is too strong. If this condition is not controlled, it may put you at risk for serious complications. Your personal target blood pressure may vary depending on your medical conditions, your age, and other factors. For most people, a normal blood pressure is less than 120/80. Hypertension is treated with lifestyle changes, medicines, or a combination of both. Lifestyle changes include losing weight, eating a healthy,   low-sodium diet, exercising more, and limiting alcohol. This information is not intended to replace advice given to you by your health care provider. Make sure you discuss any questions you have with your health care provider. Document Revised: 06/01/2021 Document Reviewed: 06/01/2021 Elsevier Patient Education  2023 [employer]  

## 2022-05-07 ENCOUNTER — Encounter: Payer: Self-pay | Admitting: Internal Medicine

## 2022-08-04 ENCOUNTER — Ambulatory Visit (HOSPITAL_COMMUNITY): Admit: 2022-08-04 | Payer: Medicaid Other

## 2022-08-26 ENCOUNTER — Telehealth: Payer: Self-pay | Admitting: Internal Medicine

## 2022-08-26 ENCOUNTER — Ambulatory Visit (INDEPENDENT_AMBULATORY_CARE_PROVIDER_SITE_OTHER): Payer: Medicaid Other | Admitting: Family Medicine

## 2022-08-26 ENCOUNTER — Ambulatory Visit (INDEPENDENT_AMBULATORY_CARE_PROVIDER_SITE_OTHER): Payer: Medicaid Other

## 2022-08-26 ENCOUNTER — Encounter: Payer: Self-pay | Admitting: Family Medicine

## 2022-08-26 ENCOUNTER — Other Ambulatory Visit (HOSPITAL_COMMUNITY)
Admission: RE | Admit: 2022-08-26 | Discharge: 2022-08-26 | Disposition: A | Discharge status: Home / Self Care | Payer: Medicaid Other | Source: Ambulatory Visit | Attending: Family Medicine | Admitting: Family Medicine

## 2022-08-26 ENCOUNTER — Ambulatory Visit: Payer: Medicaid Other

## 2022-08-26 VITALS — BP 146/104 | HR 72 | Temp 97.6°F | Ht 65.0 in | Wt 242.0 lb

## 2022-08-26 DIAGNOSIS — R002 Palpitations: Secondary | ICD-10-CM

## 2022-08-26 DIAGNOSIS — N898 Other specified noninflammatory disorders of vagina: Secondary | ICD-10-CM

## 2022-08-26 DIAGNOSIS — I119 Hypertensive heart disease without heart failure: Secondary | ICD-10-CM

## 2022-08-26 DIAGNOSIS — R0602 Shortness of breath: Secondary | ICD-10-CM

## 2022-08-26 DIAGNOSIS — I10 Essential (primary) hypertension: Secondary | ICD-10-CM

## 2022-08-26 DIAGNOSIS — F419 Anxiety disorder, unspecified: Secondary | ICD-10-CM | POA: Diagnosis not present

## 2022-08-26 LAB — T4, FREE: Free T4: 0.74 ng/dL (ref 0.60–1.60)

## 2022-08-26 LAB — COMPREHENSIVE METABOLIC PANEL
ALT: 21 U/L (ref 0–35)
AST: 17 U/L (ref 0–37)
Albumin: 4.1 g/dL (ref 3.5–5.2)
Alkaline Phosphatase: 64 U/L (ref 39–117)
BUN: 8 mg/dL (ref 6–23)
CO2: 29 mEq/L (ref 19–32)
Calcium: 9.3 mg/dL (ref 8.4–10.5)
Chloride: 103 mEq/L (ref 96–112)
Creatinine, Ser: 0.6 mg/dL (ref 0.40–1.20)
GFR: 117 mL/min (ref >60.00)
Glucose, Bld: 98 mg/dL (ref 70–99)
Potassium: 3.7 mEq/L (ref 3.5–5.1)
Sodium: 141 mEq/L (ref 135–145)
Total Bilirubin: 0.7 mg/dL (ref 0.2–1.2)
Total Protein: 7.7 g/dL (ref 6.0–8.3)

## 2022-08-26 LAB — CBC WITH DIFFERENTIAL/PLATELET
Basophils Absolute: 0 10*3/uL (ref 0.0–0.1)
Basophils Relative: 0.4 % (ref 0.0–3.0)
Eosinophils Absolute: 0 10*3/uL (ref 0.0–0.7)
Eosinophils Relative: 0.9 % (ref 0.0–5.0)
HCT: 41.9 % (ref 36.0–46.0)
Hemoglobin: 14.4 g/dL (ref 12.0–15.0)
Lymphocytes Relative: 24.9 % (ref 12.0–46.0)
Lymphs Abs: 1.2 10*3/uL (ref 0.7–4.0)
MCHC: 34.3 g/dL (ref 30.0–36.0)
MCV: 82.9 fl (ref 78.0–100.0)
Monocytes Absolute: 0.3 10*3/uL (ref 0.1–1.0)
Monocytes Relative: 5.8 % (ref 3.0–12.0)
Neutro Abs: 3.2 10*3/uL (ref 1.4–7.7)
Neutrophils Relative %: 68 % (ref 43.0–77.0)
Platelets: 346 10*3/uL (ref 150.0–400.0)
RBC: 5.05 Mil/uL (ref 3.87–5.11)
RDW: 13.2 % (ref 11.5–15.5)
WBC: 4.8 10*3/uL (ref 4.0–10.5)

## 2022-08-26 LAB — TSH: TSH: 0.5 u[IU]/mL (ref 0.35–5.50)

## 2022-08-26 MED ORDER — TRIAMTERENE-HCTZ 37.5-25 MG PO CAPS: 1.0000 | ORAL_CAPSULE | Freq: Every day | ORAL | 1 refills | Status: DC

## 2022-08-26 NOTE — Telephone Encounter (Signed)
Patient has requested to see a different provider. I am messaging you all to see if you both would be okay with this transition.

## 2022-08-26 NOTE — Telephone Encounter (Signed)
Patient has requested to be seen by a different provider. I am messaging you all to see if you both are okay with this transition.

## 2022-08-26 NOTE — Patient Instructions (Addendum)
Please go downstairs for labs and a chest x-ray before you leave today.  Continue labetalol 100 mg twice daily.  Start the new medication, Dyazide, daily as prescribed.  This is a diuretic and may make you have to urinate extra.  Continue to monitor your blood pressure at home.  Follow-up with Dr. Ronnald Ramp in 4 to 6 weeks.     DASH Eating Plan DASH stands for Dietary Approaches to Stop Hypertension. The DASH eating plan is a healthy eating plan that has been shown to: Reduce high blood pressure (hypertension). Reduce your risk for type 2 diabetes, heart disease, and stroke. Help with weight loss. What are tips for following this plan? Reading food labels Check food labels for the amount of salt (sodium) per serving. Choose foods with less than 5 percent of the Daily Value of sodium. Generally, foods with less than 300 milligrams (mg) of sodium per serving fit into this eating plan. To find whole grains, look for the word "whole" as the first word in the ingredient list. Shopping Buy products labeled as "low-sodium" or "no salt added." Buy fresh foods. Avoid canned foods and pre-made or frozen meals. Cooking Avoid adding salt when cooking. Use salt-free seasonings or herbs instead of table salt or sea salt. Check with your health care provider or pharmacist before using salt substitutes. Do not fry foods. Cook foods using healthy methods such as baking, boiling, grilling, roasting, and broiling instead. Cook with heart-healthy oils, such as olive, canola, avocado, soybean, or sunflower oil. Meal planning  Eat a balanced diet that includes: 4 or more servings of fruits and 4 or more servings of vegetables each day. Try to fill one-half of your plate with fruits and vegetables. 6-8 servings of whole grains each day. Less than 6 oz (170 g) of lean meat, poultry, or fish each day. A 3-oz (85-g) serving of meat is about the same size as a deck of cards. One egg equals 1 oz (28 g). 2-3  servings of low-fat dairy each day. One serving is 1 cup (237 mL). 1 serving of nuts, seeds, or beans 5 times each week. 2-3 servings of heart-healthy fats. Healthy fats called omega-3 fatty acids are found in foods such as walnuts, flaxseeds, fortified milks, and eggs. These fats are also found in cold-water fish, such as sardines, salmon, and mackerel. Limit how much you eat of: Canned or prepackaged foods. Food that is high in trans fat, such as some fried foods. Food that is high in saturated fat, such as fatty meat. Desserts and other sweets, sugary drinks, and other foods with added sugar. Full-fat dairy products. Do not salt foods before eating. Do not eat more than 4 egg yolks a week. Try to eat at least 2 vegetarian meals a week. Eat more home-cooked food and less restaurant, buffet, and fast food. Lifestyle When eating at a restaurant, ask that your food be prepared with less salt or no salt, if possible. If you drink alcohol: Limit how much you use to: 0-1 drink a day for women who are not pregnant. 0-2 drinks a day for men. Be aware of how much alcohol is in your drink. In the [place] one drink equals one 12 oz bottle of beer (355 mL), one 5 oz glass of wine (148 mL), or one 1 oz glass of hard liquor (44 mL). General information Avoid eating more than 2,300 mg of salt a day. If you have hypertension, you may need to reduce your sodium intake to  1,500 mg a day. Work with your health care provider to maintain a healthy body weight or to lose weight. Ask what an ideal weight is for you. Get at least 30 minutes of exercise that causes your heart to beat faster (aerobic exercise) most days of the week. Activities may include walking, swimming, or biking. Work with your health care provider or dietitian to adjust your eating plan to your individual calorie needs. What foods should I eat? Fruits All fresh, dried, or frozen fruit. Canned fruit in natural juice (without added  sugar). Vegetables Fresh or frozen vegetables (raw, steamed, roasted, or grilled). Low-sodium or reduced-sodium tomato and vegetable juice. Low-sodium or reduced-sodium tomato sauce and tomato paste. Low-sodium or reduced-sodium canned vegetables. Grains Whole-grain or whole-wheat bread. Whole-grain or whole-wheat pasta. Brown rice. [place]. Bulgur. Whole-grain and low-sodium cereals. Pita bread. Low-fat, low-sodium crackers. Whole-wheat flour tortillas. Meats and other proteins Skinless chicken or [place]. Ground chicken or [place]. Pork with fat trimmed off. Fish and seafood. Egg whites. Dried beans, peas, or lentils. Unsalted nuts, nut butters, and seeds. Unsalted canned beans. Lean cuts of beef with fat trimmed off. Low-sodium, lean precooked or cured meat, such as sausages or meat loaves. Dairy Low-fat (1%) or fat-free (skim) milk. Reduced-fat, low-fat, or fat-free cheeses. Nonfat, low-sodium ricotta or cottage cheese. Low-fat or nonfat yogurt. Low-fat, low-sodium cheese. Fats and oils Soft margarine without trans fats. Vegetable oil. Reduced-fat, low-fat, or light mayonnaise and salad dressings (reduced-sodium). Canola, safflower, olive, avocado, soybean, and sunflower oils. Avocado. Seasonings and condiments Herbs. Spices. Seasoning mixes without salt. Other foods Unsalted popcorn and pretzels. Fat-free sweets. The items listed above may not be a complete list of foods and beverages you can eat. Contact a dietitian for more information. What foods should I avoid? Fruits Canned fruit in a light or heavy syrup. Fried fruit. Fruit in cream or butter sauce. Vegetables Creamed or fried vegetables. Vegetables in a cheese sauce. Regular canned vegetables (not low-sodium or reduced-sodium). Regular canned tomato sauce and paste (not low-sodium or reduced-sodium). Regular tomato and vegetable juice (not low-sodium or reduced-sodium). Angie Fava. Olives. Grains Baked goods made with fat, such  as croissants, muffins, or some breads. Dry pasta or rice meal packs. Meats and other proteins Fatty cuts of meat. Ribs. Fried meat. Berniece Salines. Bologna, salami, and other precooked or cured meats, such as sausages or meat loaves. Fat from the back of a pig (fatback). Bratwurst. Salted nuts and seeds. Canned beans with added salt. Canned or smoked fish. Whole eggs or egg yolks. Chicken or [place] with skin. Dairy Whole or 2% milk, cream, and half-and-half. Whole or full-fat cream cheese. Whole-fat or sweetened yogurt. Full-fat cheese. Nondairy creamers. Whipped toppings. Processed cheese and cheese spreads. Fats and oils Butter. Stick margarine. Lard. Shortening. Ghee. Bacon fat. Tropical oils, such as coconut, palm kernel, or palm oil. Seasonings and condiments Onion salt, garlic salt, seasoned salt, table salt, and sea salt. Worcestershire sauce. Tartar sauce. Barbecue sauce. Teriyaki sauce. Soy sauce, including reduced-sodium. Steak sauce. Canned and packaged gravies. Fish sauce. Oyster sauce. Cocktail sauce. Store-bought horseradish. Ketchup. Mustard. Meat flavorings and tenderizers. Bouillon cubes. Hot sauces. Pre-made or packaged marinades. Pre-made or packaged taco seasonings. Relishes. Regular salad dressings. Other foods Salted popcorn and pretzels. The items listed above may not be a complete list of foods and beverages you should avoid. Contact a dietitian for more information. Where to find more information National Heart, Lung, and Blood Institute: https://wilson-eaton.com/ American Heart Association: www.heart.org Academy of Nutrition and Dietetics:  www.eatright.[place]: www.kidney.org Summary The DASH eating plan is a healthy eating plan that has been shown to reduce high blood pressure (hypertension). It may also reduce your risk for type 2 diabetes, heart disease, and stroke. When on the DASH eating plan, aim to eat more fresh fruits and vegetables, whole grains, lean  proteins, low-fat dairy, and heart-healthy fats. With the DASH eating plan, you should limit salt (sodium) intake to 2,300 mg a day. If you have hypertension, you may need to reduce your sodium intake to 1,500 mg a day. Work with your health care provider or dietitian to adjust your eating plan to your individual calorie needs. This information is not intended to replace advice given to you by your health care provider. Make sure you discuss any questions you have with your health care provider. Document Revised: 06/28/2019 Document Reviewed: 06/28/2019 Elsevier Patient Education  [place].

## 2022-08-26 NOTE — Progress Notes (Signed)
Subjective:     Patient ID: Jillian Rowland, female    DOB: Nov 20, 1985, 37 y.o.   MRN: [id number]  Chief Complaint  Patient presents with   Hypertension    BP 180/100something whenever she takes her BP since last time she came. Was told to double up on meds and doesn't think it works and she's getting anxious and shaking scared something is gonna happen    Hypertension   Patient is in today for elevated BP.  She is also having intermittent palpitations some days. 1-2 times per day. No chest pain or dizziness. No LE edema.  Shortness of breath and nausea also with palpitations.   Pulse up to 111. BP at work has been very high.   Anxious and is unsure if anxiety is cause.   Taking labetalol 100 mg bid.   Denies being sexually active. She does not want to take birth control.   Vaginal discharge and would like testing for yeast and STDs.   Denies hx of DVT or PE. No leg or calf pain.   Denies fever, chills,  abdominal pain, N/V/D, urinary symptoms.   Health Maintenance Due  Topic Date Due   DTaP/Tdap/Td (1 - Tdap) Never done    Past Medical History:  Diagnosis Date   Abnormal Pap smear    Bacterial vaginal infection    Bacterial vaginitis 10/24/2017   Chlamydia    Gonorrhea    Hypertension    Migraine    Pollen allergy    Yeast vaginitis     Past Surgical History:  Procedure Laterality Date   CESAREAN SECTION  2009   x 1    Family History  Problem Relation Age of Onset   Asthma Mother    Diabetes Father    Hypertension Father     Social History   Socioeconomic History   Marital status: Single    Spouse name: Not on file   Number of children: Not on file   Years of education: Not on file   Highest education level: Not on file  Occupational History   Not on file  Tobacco Use   Smoking status: Every Day    Packs/day: 0.25    Years: 3.00    Total pack years: 0.75    Types: Cigarettes   Smokeless tobacco: Never  Vaping Use   Vaping Use:  Every day  Substance and Sexual Activity   Alcohol use: Yes    Alcohol/week: 2.0 standard drinks of alcohol    Types: 2 Shots of liquor per week    Comment: Occasional   Drug use: No    Comment: none   Sexual activity: Yes    Birth control/protection: None  Other Topics Concern   Not on file  Social History Narrative   Not on file   Social Determinants of Health   Financial Resource Strain: Not on file  Food Insecurity: Not on file  Transportation Needs: Not on file  Physical Activity: Not on file  Stress: Not on file  Social Connections: Not on file  Intimate Partner Violence: Not on file    Outpatient Medications Prior to Visit  Medication Sig Dispense Refill   labetalol (NORMODYNE) 100 MG tablet Take 1 tablet (100 mg total) by mouth 2 (two) times daily. 180 tablet 1   VALTREX 1 g tablet Take 1 tablet (1,000 mg total) by mouth 2 (two) times daily. (Patient not taking: Reported on 08/26/2022) 14 tablet 3   No facility-administered medications prior  to visit.    Allergies  Allergen Reactions   Latex     Causes yeast infection    ROS     Objective:    Physical Exam Constitutional:      General: She is not in acute distress.    Appearance: She is obese. She is not ill-appearing.  Cardiovascular:     Rate and Rhythm: Normal rate and regular rhythm.  Pulmonary:     Effort: Pulmonary effort is normal.     Breath sounds: Normal breath sounds.  Musculoskeletal:     Right lower leg: No edema.     Left lower leg: No edema.  Skin:    General: Skin is warm and dry.  Neurological:     General: No focal deficit present.     Mental Status: She is alert and oriented to person, place, and time.  Psychiatric:        Mood and Affect: Mood normal.        Behavior: Behavior normal.        Thought Content: Thought content normal.     BP (!) 146/104 (BP Location: Left Arm, Patient Position: Supine, Cuff Size: Large)   Pulse 72   Temp 97.6 F (36.4 C) (Temporal)   Ht  5\' 5"  (1.651 m)   Wt 242 lb (109.8 kg)   SpO2 96%   BMI 40.27 kg/m  Wt Readings from Last 3 Encounters:  08/26/22 242 lb (109.8 kg)  05/05/22 242 lb (109.8 kg)  03/29/22 243 lb (110.2 kg)       Assessment & Plan:   Problem List Items Addressed This Visit   None Visit Diagnoses     Intermittent palpitations    -  Primary   Relevant Orders   CBC with Differential/Platelet   Comprehensive metabolic panel   TSH   T4, free   D-Dimer, Quantitative   EKG 12-Lead   DG Chest 2 View   Vaginal discharge       Relevant Orders   Cervicovaginal ancillary only( [place])   RPR   HIV Antibody (routine testing w rflx)   Hepatitis C antibody   Anxiety       Uncontrolled hypertension       Relevant Medications   triamterene-hydrochlorothiazide (DYAZIDE) 37.5-25 MG capsule   Other Relevant Orders   CBC with Differential/Platelet   Comprehensive metabolic panel   TSH   T4, free   DG Chest 2 View   Shortness of breath       Relevant Orders   CBC with Differential/Platelet   Comprehensive metabolic panel   TSH   D-Dimer, Quantitative   EKG 12-Lead   DG Chest 2 View   Hypertensive left ventricular hypertrophy, without heart failure       Relevant Medications   triamterene-hydrochlorothiazide (DYAZIDE) 37.5-25 MG capsule      She is here today for an acute visit for uncontrolled hypertension.  She has a new concern regarding a 3-week history of palpitations, shortness of breath and does report having anxiety which could be an underlying cause. She does not have chest pain.  She also has a history of low TSH and was referred to endocrinology by PCP.  She did not schedule an appointment with them.  Recheck thyroid function today.  She may need a new referral to endocrinology if abnormal. Check other labs including D-dimer and chest x-ray to look for etiology and complications from uncontrolled HTN.  Discussed that anxiety may be playing a role  in symptoms.   EKG shows NSR, rate  67, LVH.  Patient was advised of the findings on EKG.  Discussed the importance of getting blood pressure under better control. Continue labetalol 100 mg twice daily and start Dyazide daily. DASH diet handout provided.  Advised to monitor blood pressure outside of here and follow-up with PCP in 4 to 6 weeks.  At the end of the visit she also requested STD testing.  States she does have some vaginal discharge.  Testing was ordered.  A self swab was performed by patient.   She also had to return to the office for urine pregnancy testing before CXR could be performed. Urine pregnancy test negative.    I have discontinued Terriona C. Holquin's Valtrex. I am also having her start on triamterene-hydrochlorothiazide. Additionally, I am having her maintain her labetalol.  Meds ordered this encounter  Medications   triamterene-hydrochlorothiazide (DYAZIDE) 37.5-25 MG capsule    Sig: Take 1 each (1 capsule total) by mouth daily.    Dispense:  30 capsule    Refill:  1    Order Specific Question:   Supervising Provider    Answer:   Hillard Danker A [[id number]]

## 2022-08-28 LAB — HEPATITIS C ANTIBODY: Hepatitis C Ab: NONREACTIVE

## 2022-08-28 LAB — HIV ANTIBODY (ROUTINE TESTING W REFLEX): HIV 1&2 Ab, 4th Generation: NONREACTIVE

## 2022-08-28 LAB — D-DIMER, QUANTITATIVE: D-Dimer, Quant: 0.28 mcg/mL FEU (ref <0.50)

## 2022-08-28 LAB — RPR: RPR Ser Ql: NONREACTIVE

## 2022-08-29 ENCOUNTER — Other Ambulatory Visit: Payer: Self-pay | Admitting: Family Medicine

## 2022-08-29 DIAGNOSIS — B3731 Acute candidiasis of vulva and vagina: Secondary | ICD-10-CM

## 2022-08-29 DIAGNOSIS — B9689 Other specified bacterial agents as the cause of diseases classified elsewhere: Secondary | ICD-10-CM

## 2022-08-29 LAB — CERVICOVAGINAL ANCILLARY ONLY
Bacterial Vaginitis (gardnerella): POSITIVE — AB
Candida Glabrata: NEGATIVE
Candida Vaginitis: POSITIVE — AB
Chlamydia: NEGATIVE
Comment: NEGATIVE
Comment: NEGATIVE
Comment: NEGATIVE
Comment: NEGATIVE
Comment: NEGATIVE
Comment: NORMAL
Neisseria Gonorrhea: NEGATIVE
Trichomonas: NEGATIVE

## 2022-08-29 MED ORDER — FLUCONAZOLE 150 MG PO TABS: 150.0000 mg | ORAL_TABLET | Freq: Once | ORAL | 0 refills | Status: AC

## 2022-08-29 MED ORDER — METRONIDAZOLE 500 MG PO TABS: 500.0000 mg | ORAL_TABLET | Freq: Two times a day (BID) | ORAL | 0 refills | Status: DC

## 2022-08-29 NOTE — Progress Notes (Signed)
Please let her know that her vaginal swab is positive for bacterial vaginitis and yeast. I will send in both medications for this. She will need to avoid alcohol with metronidazole for bacterial vaginitis. She is negative for sexually transmitted infections.

## 2022-08-29 NOTE — Telephone Encounter (Signed)
Fine with TOC but would need to keep care with current provider until [facility] visit.

## 2023-04-13 ENCOUNTER — Ambulatory Visit
Admission: RE | Admit: 2023-04-13 | Discharge: 2023-04-13 | Disposition: A | Discharge status: Home / Self Care | Payer: Self-pay | Source: Ambulatory Visit | Attending: Internal Medicine | Admitting: Internal Medicine

## 2023-04-13 VITALS — BP 159/87 | HR 75 | Temp 99.3°F | Resp 18

## 2023-04-13 DIAGNOSIS — O1490 Unspecified pre-eclampsia, unspecified trimester: Secondary | ICD-10-CM | POA: Insufficient documentation

## 2023-04-13 DIAGNOSIS — Z1152 Encounter for screening for COVID-19: Secondary | ICD-10-CM | POA: Insufficient documentation

## 2023-04-13 DIAGNOSIS — Z3201 Encounter for pregnancy test, result positive: Secondary | ICD-10-CM | POA: Insufficient documentation

## 2023-04-13 DIAGNOSIS — B9789 Other viral agents as the cause of diseases classified elsewhere: Secondary | ICD-10-CM | POA: Insufficient documentation

## 2023-04-13 DIAGNOSIS — R350 Frequency of micturition: Secondary | ICD-10-CM | POA: Insufficient documentation

## 2023-04-13 DIAGNOSIS — J988 Other specified respiratory disorders: Secondary | ICD-10-CM | POA: Insufficient documentation

## 2023-04-13 DIAGNOSIS — R070 Pain in throat: Secondary | ICD-10-CM | POA: Insufficient documentation

## 2023-04-13 LAB — POCT URINALYSIS DIP (MANUAL ENTRY)
Bilirubin, UA: NEGATIVE
Blood, UA: NEGATIVE
Glucose, UA: NEGATIVE mg/dL
Ketones, POC UA: NEGATIVE mg/dL
Leukocytes, UA: NEGATIVE
Nitrite, UA: NEGATIVE
Protein Ur, POC: NEGATIVE mg/dL
Spec Grav, UA: 1.015 (ref 1.010–1.025)
Urobilinogen, UA: 1 U/dL
pH, UA: 6.5 (ref 5.0–8.0)

## 2023-04-13 LAB — POCT URINE PREGNANCY: Preg Test, Ur: POSITIVE — AB

## 2023-04-13 LAB — POCT RAPID STREP A (OFFICE): Rapid Strep A Screen: NEGATIVE

## 2023-04-13 MED ORDER — FLUTICASONE PROPIONATE 50 MCG/ACT NA SUSP: 2.0000 | Freq: Every day | NASAL | 12 refills | Status: DC

## 2023-04-13 MED ORDER — ACETAMINOPHEN 325 MG PO TABS
650.0000 mg | ORAL_TABLET | Freq: Four times a day (QID) | ORAL | 0 refills | Status: AC | PRN
Start: 2023-04-13 — End: ?

## 2023-04-13 MED ORDER — CETIRIZINE HCL 10 MG PO TABS
10.0000 mg | ORAL_TABLET | Freq: Every day | ORAL | 0 refills | Status: AC
Start: 2023-04-13 — End: ?

## 2023-04-13 NOTE — ED Provider Notes (Signed)
[facility]  Note:  This document was prepared using Conservation officer, historic buildings and may include unintentional dictation errors.  MRN: [id number] DOB: 09/08/1985  Subjective:   Jillian Rowland is a 37 y.o. female presenting for 5-day history of urinary frequency, low back pain, pelvic cramping.  She has also had a runny stuffy nose and throat pain.  Would like STI testing.  She had vaginal spotting 04/02/2023.  If she is pregnant, would be an unplanned pregnancy.  No chest pain, shortness of breath or wheezing.  No vaginal discharge.  No fall, trauma, numbness or tingling, saddle paresthesia, changes to bowel or urinary habits, radicular symptoms.   Currently takes labetalol only.  Allergies  Allergen Reactions   Latex     Causes yeast infection    Past Medical History:  Diagnosis Date   Abnormal Pap smear    Bacterial vaginal infection    Bacterial vaginitis 10/24/2017   Chlamydia    Gonorrhea    Hypertension    Migraine    Pollen allergy    Yeast vaginitis      Past Surgical History:  Procedure Laterality Date   CESAREAN SECTION  2009   x 1    Family History  Problem Relation Age of Onset   Asthma Mother    Diabetes Father    Hypertension Father     Social History   Tobacco Use   Smoking status: Every Day    Current packs/day: 0.25    Average packs/day: 0.3 packs/day for 3.0 years (0.8 ttl pk-yrs)    Types: Cigarettes   Smokeless tobacco: Never  Vaping Use   Vaping status: Every Day  Substance Use Topics   Alcohol use: Yes    Alcohol/week: 2.0 standard drinks of alcohol    Types: 2 Shots of liquor per week    Comment: Occasional   Drug use: No    Comment: none    ROS   Objective:   Vitals: BP (!) 159/87 (BP Location: Right Arm)   Pulse 75   Temp 99.3 F (37.4 C) (Oral)   Resp 18   LMP 04/02/2023 (Approximate) Comment: 04/02/23 spotted x 1 day.  SpO2 98%   Physical Exam Constitutional:      General: She  is not in acute distress.    Appearance: Normal appearance. She is well-developed and normal weight. She is not ill-appearing, toxic-appearing or diaphoretic.  HENT:     Head: Normocephalic and atraumatic.     Right Ear: Tympanic membrane, ear canal and external ear normal. No drainage or tenderness. No middle ear effusion. There is no impacted cerumen. Tympanic membrane is not erythematous or bulging.     Left Ear: Tympanic membrane, ear canal and external ear normal. No drainage or tenderness.  No middle ear effusion. There is no impacted cerumen. Tympanic membrane is not erythematous or bulging.     Nose: Nose normal. No congestion or rhinorrhea.     Mouth/Throat:     Mouth: Mucous membranes are moist. No oral lesions.     Pharynx: No pharyngeal swelling, oropharyngeal exudate, posterior oropharyngeal erythema or uvula swelling.     Tonsils: No tonsillar exudate or tonsillar abscesses.  Eyes:     General: No scleral icterus.       Right eye: No discharge.        Left eye: No discharge.     Extraocular Movements: Extraocular movements intact.     Right eye: Normal extraocular motion.  Left eye: Normal extraocular motion.     Conjunctiva/sclera: Conjunctivae normal.  Cardiovascular:     Rate and Rhythm: Normal rate and regular rhythm.     Heart sounds: Normal heart sounds. No murmur heard.    No friction rub. No gallop.  Pulmonary:     Effort: Pulmonary effort is normal. No respiratory distress.     Breath sounds: No stridor. No wheezing, rhonchi or rales.  Chest:     Chest wall: No tenderness.  Abdominal:     General: Bowel sounds are normal. There is no distension.     Palpations: Abdomen is soft. There is no mass.     Tenderness: There is no abdominal tenderness. There is no right CVA tenderness, left CVA tenderness, guarding or rebound.  Musculoskeletal:     Cervical back: Normal range of motion and neck supple.  Lymphadenopathy:     Cervical: No cervical adenopathy.   Skin:    General: Skin is warm and dry.  Neurological:     General: No focal deficit present.     Mental Status: She is alert and oriented to person, place, and time.  Psychiatric:        Mood and Affect: Mood normal.        Behavior: Behavior normal.        Thought Content: Thought content normal.        Judgment: Judgment normal.     Results for orders placed or performed during the hospital encounter of 04/13/23 (from the past 24 hour(s))  POCT urine pregnancy     Status: Abnormal   Collection Time: 04/13/23  6:03 PM  Result Value Ref Range   Preg Test, Ur Positive (A) Negative  POCT urinalysis dipstick     Status: None   Collection Time: 04/13/23  6:04 PM  Result Value Ref Range   Color, UA yellow yellow   Clarity, UA clear clear   Glucose, UA negative negative mg/dL   Bilirubin, UA negative negative   Ketones, POC UA negative negative mg/dL   Spec Grav, UA 4.696 2.952 - 1.025   Blood, UA negative negative   pH, UA 6.5 5.0 - 8.0   Protein Ur, POC negative negative mg/dL   Urobilinogen, UA 1.0 0.2 or 1.0 E.U./dL   Nitrite, UA Negative Negative   Leukocytes, UA Negative Negative    Assessment and Plan :   PDMP not reviewed this encounter.  1. Viral respiratory illness   2. Urinary frequency   3. Positive pregnancy test   4. Throat pain   5. Pre-eclampsia, antepartum    Will manage for viral illness such as viral URI, viral syndrome, viral rhinitis, COVID-19. Recommended supportive care. Offered scripts for symptomatic relief. Testing is pending. Counseled patient on potential for adverse effects with medications prescribed/recommended today, ER and return-to-clinic precautions discussed, patient verbalized understanding.   Regarding her pregnancy test result, this is unplanned for the patient.  Emphasized need to start prenatal vitamin, follow-up with a gynecologist to discuss her options.  Will defer any empiric treatments as patient is not sure that she will want  to keep her pregnancy.  Cytology pending.   Wallis Bamberg, [place] 04/13/23 [id number]

## 2023-04-13 NOTE — ED Triage Notes (Signed)
Pt reports increase urinary frequency, low back pain and low abdominal pain x 5 days; sore throat and "I may have fever" x 2 days.    Pt requested STD's test.

## 2023-04-13 NOTE — Discharge Instructions (Addendum)
Make sure you hydrate very well with plain water and a quantity of 80 ounces of water a day.  Please limit drinks that are considered urinary irritants such as soda, sweet tea, coffee, energy drinks, alcohol.  These can worsen your urinary and genital symptoms but also be the source of them.  I will let you know about your urine culture results through MyChart to see if we need to prescribe or change your antibiotics based off of those results.   We will notify you of your test results as they arrive and may take between about 24 hours.  I encourage you to sign up for MyChart if you have not already done so as this can be the easiest way for [place] to communicate results to you online or through a phone app.  Generally, we only contact you if it is a positive test result.  In the meantime, if you develop worsening symptoms including fever, chest pain, shortness of breath despite our current treatment plan then please report to the emergency room as this may be a sign of worsening status from possible viral infection.  Otherwise, we will manage this as a viral syndrome. For sore throat or cough try using a honey-based tea. Use 3 teaspoons of honey with juice squeezed from half lemon. Place shaved pieces of ginger into 1/2-1 cup of water and warm over stove top. Then mix the ingredients and repeat every 4 hours as needed. Please take Tylenol 650mg  every 6 hours for aches and pains, fevers. Hydrate very well with at least 2 liters of water. Eat light meals such as soups to replenish electrolytes and soft fruits, veggies. Start an antihistamine like Zyrtec (10mg  daily) and Flonase for postnasal drainage, sinus congestion.  You can take this together with non-alcohol cough drops over-the-counter as needed.

## 2023-04-14 LAB — CERVICOVAGINAL ANCILLARY ONLY
Bacterial Vaginitis (gardnerella): POSITIVE — AB
Candida Glabrata: NEGATIVE
Candida Vaginitis: POSITIVE — AB
Chlamydia: NEGATIVE
Comment: NEGATIVE
Comment: NEGATIVE
Comment: NEGATIVE
Comment: NEGATIVE
Comment: NEGATIVE
Comment: NORMAL
Neisseria Gonorrhea: NEGATIVE
Trichomonas: POSITIVE — AB

## 2023-04-14 LAB — SARS CORONAVIRUS 2 (TAT 6-24 HRS): SARS Coronavirus 2: NEGATIVE

## 2023-04-15 ENCOUNTER — Other Ambulatory Visit: Payer: Self-pay

## 2023-04-15 ENCOUNTER — Inpatient Hospital Stay (HOSPITAL_COMMUNITY)
Admission: AD | Admit: 2023-04-15 | Discharge: 2023-04-15 | Disposition: A | Discharge status: Home / Self Care | Payer: Self-pay | Attending: Obstetrics & Gynecology | Admitting: Obstetrics & Gynecology

## 2023-04-15 ENCOUNTER — Encounter (HOSPITAL_COMMUNITY): Payer: Self-pay | Admitting: *Deleted

## 2023-04-15 ENCOUNTER — Inpatient Hospital Stay (HOSPITAL_COMMUNITY): Payer: Self-pay

## 2023-04-15 DIAGNOSIS — B9689 Other specified bacterial agents as the cause of diseases classified elsewhere: Secondary | ICD-10-CM

## 2023-04-15 DIAGNOSIS — A599 Trichomoniasis, unspecified: Secondary | ICD-10-CM

## 2023-04-15 DIAGNOSIS — Z3A01 Less than 8 weeks gestation of pregnancy: Secondary | ICD-10-CM

## 2023-04-15 DIAGNOSIS — B3731 Acute candidiasis of vulva and vagina: Secondary | ICD-10-CM

## 2023-04-15 DIAGNOSIS — A5901 Trichomonal vulvovaginitis: Secondary | ICD-10-CM | POA: Insufficient documentation

## 2023-04-15 DIAGNOSIS — O10011 Pre-existing essential hypertension complicating pregnancy, first trimester: Secondary | ICD-10-CM | POA: Insufficient documentation

## 2023-04-15 DIAGNOSIS — N760 Acute vaginitis: Secondary | ICD-10-CM | POA: Insufficient documentation

## 2023-04-15 DIAGNOSIS — O23591 Infection of other part of genital tract in pregnancy, first trimester: Secondary | ICD-10-CM | POA: Insufficient documentation

## 2023-04-15 LAB — CBC
HCT: 37.2 % (ref 36.0–46.0)
Hemoglobin: 12.8 g/dL (ref 12.0–15.0)
MCH: 29 pg (ref 26.0–34.0)
MCHC: 34.4 g/dL (ref 30.0–36.0)
MCV: 84.4 fL (ref 80.0–100.0)
Platelets: 283 10*3/uL (ref 150–400)
RBC: 4.41 MIL/uL (ref 3.87–5.11)
RDW: 12.7 % (ref 11.5–15.5)
WBC: 7.3 10*3/uL (ref 4.0–10.5)
nRBC: 0 % (ref 0.0–0.2)

## 2023-04-15 LAB — HCG, QUANTITATIVE, PREGNANCY: hCG, Beta Chain, Quant, S: 59663 m[IU]/mL — ABNORMAL HIGH (ref <5)

## 2023-04-15 LAB — URINE CULTURE

## 2023-04-15 MED ORDER — ASPIRIN 81 MG PO TBEC: 162.0000 mg | DELAYED_RELEASE_TABLET | Freq: Every day | ORAL | 12 refills | Status: DC

## 2023-04-15 MED ORDER — PREPLUS 27-1 MG PO TABS
1.0000 | ORAL_TABLET | Freq: Every day | ORAL | 13 refills | Status: DC
Start: 2023-04-15 — End: 2024-01-20

## 2023-04-15 MED ORDER — NIFEDIPINE ER OSMOTIC RELEASE 30 MG PO TB24: 30.0000 mg | ORAL_TABLET | Freq: Every day | ORAL | 6 refills | Status: DC

## 2023-04-15 MED ORDER — TERCONAZOLE 0.4 % VA CREA: 1.0000 | TOPICAL_CREAM | Freq: Every day | VAGINAL | 0 refills | Status: AC

## 2023-04-15 MED ORDER — METRONIDAZOLE 500 MG PO TABS
2000.0000 mg | ORAL_TABLET | Freq: Once | ORAL | Status: AC
  Administered 2023-04-15: 2000 mg via ORAL
  Filled 2023-04-15: qty 4

## 2023-04-15 NOTE — MAU Provider Note (Signed)
Chief Complaint:  Abdominal Pain  HPI   Event Date/Time   First Provider Initiated Contact with Patient 04/15/23 1651     Jillian Rowland is a 37 y.o. G3P1011 at unknown gestation who presents to maternity admissions reporting mild lower abdominal cramping, known (but yet to be treated) trichomoniasis, yeast and BV, and unsure pregnancy dating. Just found out she is pregnant on the 5th and is concerned about the cramping she is having. Had some spotting on 04/02/23 but nothing since. No other physical complaints.   Pregnancy Course: Has not established prenatal care, pregnancy diagnosed at Urgent Care. Has a history of hypertension, is taking 100mg  labetalol BID but does not feel it is controlling her BP well. Has only ever been on losartan then labetalol with a diuretic.   Past Medical History:  Diagnosis Date   Abnormal Pap smear    Bacterial vaginal infection    Bacterial vaginitis 10/24/2017   Chlamydia    Gonorrhea    Hypertension    Migraine    Pollen allergy    Yeast vaginitis    OB History  Gravida Para Term Preterm AB Living  3 1 1   1 1   SAB IAB Ectopic Multiple Live Births  1       1    # Outcome Date GA Lbr Len/2nd Weight Sex Type Anes PTL Lv  3 Current           2 Term 2009     CS-LTranv   LIV  1 SAB            Past Surgical History:  Procedure Laterality Date   CESAREAN SECTION  2009   x 1   Family History  Problem Relation Age of Onset   Asthma Mother    Diabetes Father    Hypertension Father    Social History   Tobacco Use   Smoking status: Every Day    Current packs/day: 0.25    Average packs/day: 0.3 packs/day for 3.0 years (0.8 ttl pk-yrs)    Types: Cigarettes   Smokeless tobacco: Never  Vaping Use   Vaping status: Every Day  Substance Use Topics   Alcohol use: Yes    Alcohol/week: 2.0 standard drinks of alcohol    Types: 2 Shots of liquor per week    Comment: Occasional   Drug use: No    Comment: none   Allergies  Allergen Reactions    Latex     Causes yeast infection   No medications prior to admission.   I have reviewed patient's Past Medical Hx, Surgical Hx, Family Hx, Social Hx, medications and allergies.   ROS  Pertinent items noted in HPI and remainder of comprehensive ROS otherwise negative.   PHYSICAL EXAM  Patient Vitals for the past 24 hrs:  BP Temp Temp src Pulse Resp SpO2 Height Weight  04/15/23 1639 (!) 152/95 98.3 F (36.8 C) Oral 70 19 100 % -- --  04/15/23 1630 -- -- -- -- -- -- 5\' 5"  (1.651 m) 232 lb 8 oz (105.5 kg)   Constitutional: Well-developed, well-nourished female in no acute distress.  Cardiovascular: normal rate & rhythm, warm and well-perfused Respiratory: normal effort, no problems with respiration noted GI: Abd soft, non-tender, non-distended MS: Extremities nontender, no edema, normal ROM Neurologic: Alert and oriented x 4.  GU: no CVA tenderness Pelvic: exam deferred, no bleeding, sent to U/S  Labs: Results for orders placed or performed during the hospital encounter of 04/15/23 (from the  past 24 hour(s))  CBC     Status: None   Collection Time: 04/15/23  5:03 PM  Result Value Ref Range   WBC 7.3 4.0 - 10.5 K/uL   RBC 4.41 3.87 - 5.11 MIL/uL   Hemoglobin 12.8 12.0 - 15.0 g/dL   HCT 81.8 29.9 - 37.1 %   MCV 84.4 80.0 - 100.0 fL   MCH 29.0 26.0 - 34.0 pg   MCHC 34.4 30.0 - 36.0 g/dL   RDW 69.6 78.9 - 38.1 %   Platelets 283 150 - 400 K/uL   nRBC 0.0 0.0 - 0.2 %  ABO/Rh     Status: None   Collection Time: 04/15/23  5:03 PM  Result Value Ref Range   ABO/RH(D)      O POS Performed at [facility], [address] [place], [place] [address]   hCG, quantitative, pregnancy     Status: Abnormal   Collection Time: 04/15/23  5:03 PM  Result Value Ref Range   hCG, Beta Chain, Quant, S 59,663 (H) <5 mIU/mL   Imaging:  US OB LESS THAN 14 WEEKS WITH OB TRANSVAGINAL  Result Date: 04/15/2023 CLINICAL DATA:  Lower abdominal pain. EXAM: OBSTETRIC <14 WK [place] AND TRANSVAGINAL  OB US TECHNIQUE: Both transabdominal and transvaginal ultrasound examinations were performed for complete evaluation of the gestation as well as the maternal uterus, adnexal regions, and pelvic cul-de-sac. Transvaginal technique was performed to assess early pregnancy. COMPARISON:  None Available. FINDINGS: Intrauterine gestational sac: Single Yolk sac:  Visualized. Embryo:  Visualized. Cardiac Activity: Visualized. Heart Rate: 82 bpm CRL:  6.0 mm   6 w   3 d                  [place] EDC: December 06, 2023 Subchorionic hemorrhage: Small (ultrasonographic measurements not provided) Maternal uterus/adnexae: The bilateral ovaries are visualized and are unremarkable. A trace amount of pelvic free fluid is seen. IMPRESSION: 1. Single, viable intrauterine pregnancy at approximately 6 weeks and 3 days gestation by ultrasound evaluation. 2. Fetal heart rate of 82 beats per minute. Electronically Signed   By: Aram Candela M.D.   On: 04/15/2023 18:38    MDM & MAU COURSE  MDM: Moderate  MAU Course: Orders Placed This Encounter  Procedures   US OB LESS THAN 14 WEEKS WITH OB TRANSVAGINAL   CBC   hCG, quantitative, pregnancy   ABO/Rh   Discharge patient   Meds ordered this encounter  Medications   Prenatal Vit-Fe Fumarate-FA (PREPLUS) 27-1 MG TABS    Sig: Take 1 tablet by mouth daily.    Dispense:  30 tablet    Refill:  13   aspirin EC 81 MG tablet    Sig: Take 2 tablets (162 mg total) by mouth daily. BEGIN TAKING AT 12 WEEKS TO PREVENT PREECLAMPSIA    Dispense:  30 tablet    Refill:  12   NIFEdipine (PROCARDIA-XL/NIFEDICAL-XL) 30 MG 24 hr tablet    Sig: Take 1 tablet (30 mg total) by mouth daily.    Dispense:  30 tablet    Refill:  6   metroNIDAZOLE (FLAGYL) tablet 2,000 mg   terconazole (TERAZOL 7) 0.4 % vaginal cream    Sig: Place 1 applicator vaginally at bedtime for 7 doses.    Dispense:  45 g    Refill:  0    Please give 7 dispensers   Pelvic pain likely due to vaginal infections, but no  documentation of IUP so ordered  full ectopic workup.   Ultrasound showed IUP at [redacted]w[redacted]d, treatments for infections sent to pharmacy. Switched her to procardia 30mg  XL daily and advised her to call [facility] to begin OB care (she currently has no insurance but anticipates starting Medicaid coverage once she can get signed up).   ASSESSMENT   1. [redacted] weeks gestation of pregnancy   2. Trichomoniasis   3. Bacterial vaginosis   4. Yeast vaginitis    PLAN  Discharge home in stable condition with first trimester bleeding precautions.     Follow-up Information     [facility]. Schedule an appointment as soon as possible for a visit.   Specialty: Obstetrics and Gynecology Contact information: [address] [place] [address] [phone]                Allergies as of 04/15/2023       Reactions   Latex    Causes yeast infection        Medication List     STOP taking these medications    labetalol 100 MG tablet Commonly known as: NORMODYNE       TAKE these medications    acetaminophen 325 MG tablet Commonly known as: Tylenol Take 2 tablets (650 mg total) by mouth every 6 (six) hours as needed for moderate pain.   aspirin EC 81 MG tablet Take 2 tablets (162 mg total) by mouth daily. BEGIN TAKING AT 12 WEEKS TO PREVENT PREECLAMPSIA   cetirizine 10 MG tablet Commonly known as: ZyrTEC Allergy Take 1 tablet (10 mg total) by mouth daily.   fluticasone 50 MCG/ACT nasal spray Commonly known as: FLONASE Place 2 sprays into both nostrils daily.   NIFEdipine 30 MG 24 hr tablet Commonly known as: PROCARDIA-XL/NIFEDICAL-XL Take 1 tablet (30 mg total) by mouth daily.   PrePLUS 27-1 MG Tabs Take 1 tablet by mouth daily.   terconazole 0.4 % vaginal cream Commonly known as: TERAZOL 7 Place 1 applicator vaginally at bedtime for 7 doses.       Edd Arbour, CNM, MSN, IBCLC Certified Nurse Midwife,  [facility]

## 2023-04-15 NOTE — Discharge Instructions (Signed)

## 2023-04-15 NOTE — MAU Note (Signed)
Jillian Rowland is a 37 y.o. at Unknown here in MAU reporting: lower abdominal pain that began on Tuesday.  Denies VB.  Also wants treatment for positive BV, yeast and trichomoniasis.  Test results seen on MyChart but hasn't been notified by MD yet. LMP: irregular, spotting on 04/02/2023, last normal was 12/31/2022 Onset of complaint: Tuesday Pain score: 7 Vitals:   04/15/23 1639  BP: (!) 152/95  Pulse: 70  Resp: 19  Temp: 98.3 F (36.8 C)  SpO2: 100%     FHT:NA Lab orders placed from triage:   None

## 2023-04-16 LAB — CULTURE, GROUP A STREP (THRC)

## 2023-04-16 LAB — ABO/RH: ABO/RH(D): O POS

## 2023-04-17 ENCOUNTER — Telehealth: Payer: Self-pay

## 2023-04-17 NOTE — Telephone Encounter (Signed)
TC to pt. Pt reports she saw results online and went to hospital for tx. No questions at the time

## 2023-05-09 ENCOUNTER — Telehealth: Payer: Self-pay

## 2023-05-15 ENCOUNTER — Encounter: Payer: Self-pay | Admitting: Obstetrics and Gynecology

## 2023-05-23 ENCOUNTER — Ambulatory Visit: Payer: Medicaid Other | Admitting: Internal Medicine

## 2023-05-23 NOTE — Progress Notes (Deleted)
Name: Jillian Rowland  MRN/ DOB: [id number], [birth year]    Age/ Sex: 37 y.o., female    PCP: Etta Grandchild, MD   Reason for Endocrinology Evaluation: Low TSH     Date of Initial Endocrinology Evaluation: 05/23/2023     HPI: [name] is a 37 y.o. female with a past medical history of ***. The patient presented for initial endocrinology clinic visit on 05/23/2023 for consultative assistance with her low TSH.   Patient was referred here for further evaluation of low TSH of 0.33u IU/mL that was noted during routine workup on 05/05/2022.  No concomitant T3 nor T4.   Repeat TFTs by 08/26/2022 shows normalization   She is currently pregnant at 11.5 weeks of gestation     HISTORY:  Past Medical History:  Past Medical History:  Diagnosis Date   Abnormal Pap smear    Bacterial vaginal infection    Bacterial vaginitis 10/24/2017   Chlamydia    Gonorrhea    Hypertension    Migraine    Pollen allergy    Yeast vaginitis    Past Surgical History:  Past Surgical History:  Procedure Laterality Date   CESAREAN SECTION  2009   x 1    Social History:  reports that she has been smoking cigarettes. She has a 0.8 pack-year smoking history. She has never used smokeless tobacco. She reports current alcohol use of about 2.0 standard drinks of alcohol per week. She reports that she does not use drugs. Family History: family history includes Asthma in her mother; Diabetes in her father; Hypertension in her father.   HOME MEDICATIONS: Allergies as of 05/23/2023       Reactions   Latex    Causes yeast infection        Medication List        Accurate as of May 23, 2023  7:08 AM. If you have any questions, ask your nurse or doctor.          acetaminophen 325 MG tablet Commonly known as: Tylenol Take 2 tablets (650 mg total) by mouth every 6 (six) hours as needed for moderate pain.   aspirin EC 81 MG tablet Take 2 tablets (162 mg total) by mouth daily.  BEGIN TAKING AT 12 WEEKS TO PREVENT PREECLAMPSIA   cetirizine 10 MG tablet Commonly known as: ZyrTEC Allergy Take 1 tablet (10 mg total) by mouth daily.   fluticasone 50 MCG/ACT nasal spray Commonly known as: FLONASE Place 2 sprays into both nostrils daily.   NIFEdipine 30 MG 24 hr tablet Commonly known as: PROCARDIA-XL/NIFEDICAL-XL Take 1 tablet (30 mg total) by mouth daily.   PrePLUS 27-1 MG Tabs Take 1 tablet by mouth daily.          REVIEW OF SYSTEMS: A comprehensive ROS was conducted with the patient and is negative except as per HPI    OBJECTIVE:  VS: LMP 04/02/2023 (Approximate) Comment: 04/02/23 spotted x 1 day.   Wt Readings from Last 3 Encounters:  04/15/23 232 lb 8 oz (105.5 kg)  08/26/22 242 lb (109.8 kg)  05/05/22 242 lb (109.8 kg)     EXAM: General: Pt appears well and is in NAD  Eyes: External eye exam normal without stare, lid lag or exophthalmos.  EOM intact.  PERRL.  Neck: General: Supple without adenopathy. Thyroid: Thyroid size normal.  No goiter or nodules appreciated. No thyroid bruit.  Lungs: Clear with good BS bilat   Heart: Auscultation: RRR.  Abdomen:  Soft, nontender  Extremities:  BL LE: No pretibial edema   Mental Status: Judgment, insight: Intact Orientation: Oriented to time, place, and person Mood and affect: No depression, anxiety, or agitation     DATA REVIEWED: ***    ASSESSMENT/PLAN/RECOMMENDATIONS:   Low TSH:     Medications :  Signed electronically by: Lyndle Herrlich, MD  [facility] Endocr[facility]ology  [facility] [address] [place] [place], [place] [address] Phone: [phone] FAX: [phone]   CC: Etta Grandchild, MD [address] [place] [address] Phone: [phone] Fax: [phone]   Return to Endocrinology clinic as below: Future Appointments  Date Time Provider Department Center  05/23/2023  9:10 AM Mohammed Mcandrew, Konrad Dolores, MD [id number] None

## 2023-08-17 ENCOUNTER — Ambulatory Visit
Admission: RE | Admit: 2023-08-17 | Discharge: 2023-08-17 | Disposition: A | Discharge status: Home / Self Care | Payer: Self-pay | Source: Ambulatory Visit | Attending: Internal Medicine | Admitting: Internal Medicine

## 2023-08-17 ENCOUNTER — Ambulatory Visit: Payer: Self-pay

## 2023-08-17 VITALS — BP 143/96 | HR 82 | Temp 98.1°F | Resp 18 | Ht 65.0 in | Wt 234.0 lb

## 2023-08-17 DIAGNOSIS — B9789 Other viral agents as the cause of diseases classified elsewhere: Secondary | ICD-10-CM | POA: Insufficient documentation

## 2023-08-17 DIAGNOSIS — B9689 Other specified bacterial agents as the cause of diseases classified elsewhere: Secondary | ICD-10-CM | POA: Insufficient documentation

## 2023-08-17 DIAGNOSIS — B379 Candidiasis, unspecified: Secondary | ICD-10-CM

## 2023-08-17 DIAGNOSIS — R059 Cough, unspecified: Secondary | ICD-10-CM | POA: Insufficient documentation

## 2023-08-17 DIAGNOSIS — J069 Acute upper respiratory infection, unspecified: Secondary | ICD-10-CM | POA: Insufficient documentation

## 2023-08-17 DIAGNOSIS — F419 Anxiety disorder, unspecified: Secondary | ICD-10-CM | POA: Insufficient documentation

## 2023-08-17 DIAGNOSIS — N898 Other specified noninflammatory disorders of vagina: Secondary | ICD-10-CM

## 2023-08-17 DIAGNOSIS — Z8616 Personal history of COVID-19: Secondary | ICD-10-CM | POA: Insufficient documentation

## 2023-08-17 DIAGNOSIS — T3695XA Adverse effect of unspecified systemic antibiotic, initial encounter: Secondary | ICD-10-CM

## 2023-08-17 DIAGNOSIS — N760 Acute vaginitis: Secondary | ICD-10-CM | POA: Insufficient documentation

## 2023-08-17 DIAGNOSIS — Z3202 Encounter for pregnancy test, result negative: Secondary | ICD-10-CM

## 2023-08-17 LAB — POCT URINALYSIS DIP (MANUAL ENTRY)
Bilirubin, UA: NEGATIVE
Glucose, UA: NEGATIVE mg/dL
Ketones, POC UA: NEGATIVE mg/dL
Leukocytes, UA: NEGATIVE
Nitrite, UA: NEGATIVE
Protein Ur, POC: NEGATIVE mg/dL
Spec Grav, UA: 1.02
Urobilinogen, UA: 0.2 U/dL
pH, UA: 6

## 2023-08-17 LAB — POCT URINE PREGNANCY: Preg Test, Ur: NEGATIVE

## 2023-08-17 MED ORDER — METRONIDAZOLE 500 MG PO TABS: 500.0000 mg | ORAL_TABLET | Freq: Two times a day (BID) | ORAL | 0 refills | Status: DC

## 2023-08-17 MED ORDER — FLUCONAZOLE 150 MG PO TABS: 150.0000 mg | ORAL_TABLET | ORAL | 0 refills | Status: DC

## 2023-08-17 MED ORDER — GUAIFENESIN ER 1200 MG PO TB12: 1200.0000 mg | ORAL_TABLET | Freq: Two times a day (BID) | ORAL | 0 refills | Status: DC

## 2023-08-17 MED ORDER — PROMETHAZINE-DM 6.25-15 MG/5ML PO SYRP: 5.0000 mL | ORAL_SOLUTION | Freq: Every evening | ORAL | 0 refills | Status: DC | PRN

## 2023-08-17 NOTE — ED Notes (Signed)
 Nasal swab collected via nasal cavity. Entered Vein for blood collection.

## 2023-08-17 NOTE — ED Triage Notes (Addendum)
 Pt presents with complaints of cough and nasal congestion x 3 days. Pt reports nose bleed yesterday. Pt's coworker tested COVID+ at work today. Pt is requesting we swab her for COVID.   Pt is also reporting increased anxiety. Pt states this is not related to the cold symptoms. I just have a lot going on in my life. I feel stressed. I feel like my heart is racing at times.   Pt would also like to be tested for STD's. Pt is having vaginal discharge. Urinary frequency is also reported. Pt denies pain. Pt states she would also like pregnancy test as well.   Tylenol  is the only medication taken for the symptoms reported with no improvement.

## 2023-08-17 NOTE — Discharge Instructions (Signed)
 You have a viral illness which will improve on its own with rest, fluids, and medications to help with your symptoms.  Tylenol , guaifenesin  (plain mucinex ), and saline nasal sprays may help relieve symptoms.   Two teaspoons of honey in 1 cup of warm water every 4-6 hours may help with throat pains.  Humidifier in room at nighttime may help soothe cough (clean well daily).   For chest pain, shortness of breath, inability to keep food or fluids down without vomiting, fever that does not respond to tylenol  or motrin , or any other severe symptoms, please go to the ER for further evaluation. Return to urgent care as needed, otherwise follow-up with PCP.    Your evaluation suggests that you likely have bacterial vaginitis.  Your vaginal testing is pending and will come back in the next 2-3 days. You will receive a phone call from staff if you test positive for any other infections. We will go ahead and start treatment for BV. Take antibiotic as prescribed to treat this. If you were given flagyl  pills, do not drink alcohol during or for 48 hours after finishing antibiotic course.  Follow-up with OB/GYN or return to urgent care as needed.

## 2023-08-17 NOTE — ED Provider Notes (Signed)
 [facility]    CSN: [id number] Arrival date & time: 08/17/23  1757      History   Chief Complaint Chief Complaint  Patient presents with   Cough    Entered by patient   Nasal Congestion   Anxiety    HPI Jillian Rowland is a 38 y.o. female.   Jillian Rowland is a 38 y.o. female presenting for chief complaint of cough, congestion, and generalized fatigue that started 3 days ago. Cough is productive with green/clear phlegm. Coworker recently tested positive for COVID and patient is concerned for same. Denies sore throat, headaches, dizziness, fever, chills, N/V/D, rash, or ear pain. Reports intermittent shortness of breath with cough. No history of asthma or chronic respiratory problems. Smokes cigars, otherwise no drug use. Taking theraflu and tylenol  with some relief.   Additionally reports vaginal discharge that is white in color and with some odor. No vaginal itching or rash. Reports urinary frequency and urinary urgency without dysuria or gross hematuria. Sexually active unprotected with 2 different female partners in the last month, unsure of exposure to STD, would like STD testing today. No recent antibiotic/steroid use. Has not tried any OTC medicines to help with vaginal discharge.  Reports increased anxiety recently, denies SI/HI.  Anxiety causes intermittent heart palpitations. She is not experiencing this currently.  She does not have a PCP and does not take any medications for anxiety at this time as she does not have insurance. She feels safe in her home environment and states she has multiple life stressors right now.     Cough Anxiety    Past Medical History:  Diagnosis Date   Abnormal Pap smear    Bacterial vaginal infection    Bacterial vaginitis 10/24/2017   Chlamydia    Gonorrhea    Hypertension    Migraine    Pollen allergy    Yeast vaginitis     Patient Active Problem List   Diagnosis Date Noted   Morbid obesity (HCC) 05/05/2022    Low TSH level 05/05/2022   Primary hypertension 05/05/2022   Amenorrhea 05/05/2022   Skin lesion of face 05/05/2022   Bacterial vaginitis 10/24/2017   Chlamydia 08/18/2016    Past Surgical History:  Procedure Laterality Date   CESAREAN SECTION  2009   x 1    OB History     Gravida  3   Para  1   Term  1   Preterm      AB  1   Living  1      SAB  1   IAB      Ectopic      Multiple      Live Births  1            Home Medications    Prior to Admission medications   Medication Sig Start Date End Date Taking? Authorizing Provider  fluconazole  (DIFLUCAN ) 150 MG tablet Take 1 tablet (150 mg total) by mouth every 3 (three) days. 08/17/23  Yes Enedelia Dorna HERO, FNP  Guaifenesin  1200 MG TB12 Take 1 tablet (1,200 mg total) by mouth in the morning and at bedtime. 08/17/23  Yes Enedelia Dorna HERO, FNP  metroNIDAZOLE  (FLAGYL ) 500 MG tablet Take 1 tablet (500 mg total) by mouth 2 (two) times daily. 08/17/23  Yes Enedelia Dorna HERO, FNP  NIFEdipine  (PROCARDIA -XL/NIFEDICAL-XL) 30 MG 24 hr tablet Take 1 tablet (30 mg total) by mouth daily. 04/15/23  Yes Vannie Matar R, CNM  promethazine -dextromethorphan (  PROMETHAZINE -DM) 6.25-15 MG/5ML syrup Take 5 mLs by mouth at bedtime as needed for cough. 08/17/23  Yes Enedelia Dorna HERO, FNP  acetaminophen  (TYLENOL ) 325 MG tablet Take 2 tablets (650 mg total) by mouth every 6 (six) hours as needed for moderate pain. 04/13/23   Christopher Savannah, PA-C  aspirin  EC 81 MG tablet Take 2 tablets (162 mg total) by mouth daily. BEGIN TAKING AT 12 WEEKS TO PREVENT PREECLAMPSIA 04/15/23   Vannie Matar R, CNM  cetirizine  (ZYRTEC  ALLERGY) 10 MG tablet Take 1 tablet (10 mg total) by mouth daily. 04/13/23   Christopher Savannah, PA-C  fluticasone  (FLONASE ) 50 MCG/ACT nasal spray Place 2 sprays into both nostrils daily. 04/13/23   Christopher Savannah, PA-C  Prenatal Vit-Fe Fumarate-FA (PREPLUS) 27-1 MG TABS Take 1 tablet by mouth daily. 04/15/23   Vannie Matar SAUNDERS, CNM     Family History Family History  Problem Relation Age of Onset   Asthma Mother    Diabetes Father    Hypertension Father     Social History Social History   Tobacco Use   Smoking status: Every Day    Current packs/day: 0.25    Average packs/day: 0.3 packs/day for 3.0 years (0.8 ttl pk-yrs)    Types: Cigarettes   Smokeless tobacco: Never  Vaping Use   Vaping status: Every Day  Substance Use Topics   Alcohol use: Yes    Alcohol/week: 2.0 standard drinks of alcohol    Types: 2 Shots of liquor per week    Comment: Occasional   Drug use: No    Comment: none     Allergies   Latex   Review of Systems Review of Systems  Respiratory:  Positive for cough.   Per HPI   Physical Exam Triage Vital Signs ED Triage Vitals  Encounter Vitals Group     BP 08/17/23 1820 (!) 143/96     Systolic BP Percentile --      Diastolic BP Percentile --      Pulse Rate 08/17/23 1820 82     Resp 08/17/23 1820 18     Temp 08/17/23 1820 98.1 F (36.7 C)     Temp Source 08/17/23 1820 Oral     SpO2 08/17/23 1820 96 %     Weight 08/17/23 1816 234 lb (106.1 kg)     Height 08/17/23 1816 5' 5 (1.651 m)     Head Circumference --      Peak Flow --      Pain Score 08/17/23 1816 0     Pain Loc --      Pain Education --      Exclude from Growth Chart --    No data found.  Updated Vital Signs BP (!) 143/96 (BP Location: Right Arm)   Pulse 82   Temp 98.1 F (36.7 C) (Oral)   Resp 18   Ht 5' 5 (1.651 m)   Wt 234 lb (106.1 kg)   LMP 08/03/2023 (Exact Date)   SpO2 96%   Breastfeeding No   BMI 38.94 kg/m   Visual Acuity Right Eye Distance:   Left Eye Distance:   Bilateral Distance:    Right Eye Near:   Left Eye Near:    Bilateral Near:     Physical Exam Vitals and nursing note reviewed.  Constitutional:      Appearance: She is not ill-appearing or toxic-appearing.  HENT:     Head: Normocephalic and atraumatic.     Right Ear: Hearing, tympanic membrane, ear canal  and  external ear normal.     Left Ear: Hearing, tympanic membrane, ear canal and external ear normal.     Nose: Congestion present.     Mouth/Throat:     Lips: Pink.     Mouth: Mucous membranes are moist. No injury or oral lesions.     Dentition: Normal dentition.     Tongue: No lesions.     Pharynx: Oropharynx is clear. Uvula midline. Posterior oropharyngeal erythema present. No pharyngeal swelling, oropharyngeal exudate, uvula swelling or postnasal drip.     Tonsils: No tonsillar exudate.     Comments: Mild erythema to posterior oropharynx with small amount of clear postnasal drainage visualized. No trismus, phonation normal, maintaining secretions without difficulty.  Eyes:     General: Lids are normal. Vision grossly intact. Gaze aligned appropriately.     Extraocular Movements: Extraocular movements intact.     Conjunctiva/sclera: Conjunctivae normal.  Neck:     Trachea: Trachea and phonation normal.  Cardiovascular:     Rate and Rhythm: Normal rate and regular rhythm.     Heart sounds: Normal heart sounds, S1 normal and S2 normal.  Pulmonary:     Effort: Pulmonary effort is normal. No respiratory distress.     Breath sounds: Normal breath sounds and air entry. No wheezing, rhonchi or rales.  Chest:     Chest wall: No tenderness.  Abdominal:     General: Abdomen is flat. Bowel sounds are normal.     Palpations: Abdomen is soft.     Tenderness: There is no abdominal tenderness. There is no right CVA tenderness, left CVA tenderness or guarding.  Musculoskeletal:     Cervical back: Neck supple.  Lymphadenopathy:     Cervical: No cervical adenopathy.  Skin:    General: Skin is warm and dry.     Capillary Refill: Capillary refill takes less than 2 seconds.     Findings: No rash.  Neurological:     General: No focal deficit present.     Mental Status: She is alert and oriented to person, place, and time. Mental status is at baseline.     Cranial Nerves: No dysarthria or facial  asymmetry.  Psychiatric:        Mood and Affect: Mood normal.        Speech: Speech normal.        Behavior: Behavior normal.        Thought Content: Thought content normal.        Judgment: Judgment normal.      UC Treatments / Results  Labs (all labs ordered are listed, but only abnormal results are displayed) Labs Reviewed  POCT URINALYSIS DIP (MANUAL ENTRY) - Abnormal; Notable for the following components:      Result Value   Blood, UA trace-intact (*)    All other components within normal limits  POCT URINE PREGNANCY - Normal  SARS CORONAVIRUS 2 (TAT 6-24 HRS)  RPR  HIV ANTIBODY (ROUTINE TESTING W REFLEX)  CERVICOVAGINAL ANCILLARY ONLY    EKG   Radiology No results found.  Procedures Procedures (including critical care time)  Medications Ordered in UC Medications - No data to display  Initial Impression / Assessment and Plan / UC Course  I have reviewed the triage vital signs and the nursing notes.  Pertinent labs & imaging results that were available during my care of the patient were reviewed by me and considered in my medical decision making (see chart for details).   1. Viral URI  with cough Suspect viral URI, viral syndrome.  Strep/viral testing: COVID testing pending, she is a candidate for antiviral if positive.   Physical exam findings reassuring, vital signs hemodynamically stable, and lungs clear, therefore deferred imaging of the chest.  Advised supportive care/prescriptions for symptomatic relief as outlined in AVS.    2. Vaginal discharge, acute vaginitis High clinical suspicion for bacterial vaginosis, therefore will treat with flagyl  empirically.  Discussed risks of disulfiram reaction, advised cessation of alcohol use during and for 72 hours after use of flagyl .  Reports antibiotic induced yeast vaginitis, will cover with diflucan .  Vaginal swab pending, will treat for other positive infections per protocol when labs result. Discussed tips to  prevent recurrent BV infections. Safe sex precautions discussed.    3. Anxiety state She is stable, though anxious. We are limited in terms of what we are able to provide for anxiety in the urgent care setting. Grounding techniques maly be helpful in coping with anxiety. No red flag signs/symptoms indicating need for referral to ER currently.  Encouraged her to follow-up with and establish care with PCP to discuss this further given this is chronic.   Counseled patient on potential for adverse effects with medications prescribed/recommended today, strict ER and return-to-clinic precautions discussed, patient verbalized understanding.    Final Clinical Impressions(s) / UC Diagnoses   Final diagnoses:  Viral URI with cough  Vaginal discharge  Negative pregnancy test  Acute vaginitis  Antibiotic-induced yeast infection     Discharge Instructions      You have a viral illness which will improve on its own with rest, fluids, and medications to help with your symptoms.  Tylenol , guaifenesin  (plain mucinex ), and saline nasal sprays may help relieve symptoms.   Two teaspoons of honey in 1 cup of warm water every 4-6 hours may help with throat pains.  Humidifier in room at nighttime may help soothe cough (clean well daily).   For chest pain, shortness of breath, inability to keep food or fluids down without vomiting, fever that does not respond to tylenol  or motrin , or any other severe symptoms, please go to the ER for further evaluation. Return to urgent care as needed, otherwise follow-up with PCP.    Your evaluation suggests that you likely have bacterial vaginitis.  Your vaginal testing is pending and will come back in the next 2-3 days. You will receive a phone call from staff if you test positive for any other infections. We will go ahead and start treatment for BV. Take antibiotic as prescribed to treat this. If you were given flagyl  pills, do not drink alcohol during or for 48  hours after finishing antibiotic course.  Follow-up with OB/GYN or return to urgent care as needed.       ED Prescriptions     Medication Sig Dispense Auth. Provider   metroNIDAZOLE  (FLAGYL ) 500 MG tablet Take 1 tablet (500 mg total) by mouth 2 (two) times daily. 14 tablet Enedelia Dorna HERO, FNP   fluconazole  (DIFLUCAN ) 150 MG tablet Take 1 tablet (150 mg total) by mouth every 3 (three) days. 2 tablet Enedelia Dorna HERO, FNP   Guaifenesin  1200 MG TB12 Take 1 tablet (1,200 mg total) by mouth in the morning and at bedtime. 14 tablet Enedelia Dorna HERO, FNP   promethazine -dextromethorphan (PROMETHAZINE -DM) 6.25-15 MG/5ML syrup Take 5 mLs by mouth at bedtime as needed for cough. 118 mL Enedelia Dorna HERO, FNP      PDMP not reviewed this encounter.   Enedelia Dorna HERO,  FNP 08/17/23 1909

## 2023-08-18 LAB — CERVICOVAGINAL ANCILLARY ONLY
Bacterial Vaginitis (gardnerella): POSITIVE — AB
Candida Glabrata: NEGATIVE
Candida Vaginitis: NEGATIVE
Chlamydia: NEGATIVE
Comment: NEGATIVE
Comment: NEGATIVE
Comment: NEGATIVE
Comment: NEGATIVE
Comment: NEGATIVE
Comment: NORMAL
Neisseria Gonorrhea: NEGATIVE
Trichomonas: NEGATIVE

## 2023-08-18 LAB — SARS CORONAVIRUS 2 (TAT 6-24 HRS): SARS Coronavirus 2: NEGATIVE

## 2023-08-19 LAB — RPR: RPR Ser Ql: NONREACTIVE

## 2023-08-19 LAB — HIV ANTIBODY (ROUTINE TESTING W REFLEX): HIV Screen 4th Generation wRfx: NONREACTIVE

## 2023-12-14 ENCOUNTER — Other Ambulatory Visit: Payer: Self-pay | Admitting: Certified Nurse Midwife

## 2023-12-18 ENCOUNTER — Ambulatory Visit (HOSPITAL_COMMUNITY): Payer: Self-pay

## 2024-01-20 ENCOUNTER — Other Ambulatory Visit: Payer: Self-pay

## 2024-01-20 ENCOUNTER — Ambulatory Visit
Admission: EM | Admit: 2024-01-20 | Discharge: 2024-01-20 | Disposition: A | Discharge status: Home / Self Care | Payer: Self-pay | Attending: Emergency Medicine | Admitting: Emergency Medicine

## 2024-01-20 DIAGNOSIS — I10 Essential (primary) hypertension: Secondary | ICD-10-CM | POA: Insufficient documentation

## 2024-01-20 DIAGNOSIS — Z113 Encounter for screening for infections with a predominantly sexual mode of transmission: Secondary | ICD-10-CM | POA: Insufficient documentation

## 2024-01-20 MED ORDER — AMLODIPINE BESYLATE 10 MG PO TABS: 10.0000 mg | ORAL_TABLET | Freq: Every day | ORAL | 2 refills | Status: DC

## 2024-01-20 MED ORDER — SUMATRIPTAN SUCCINATE 6 MG/0.5ML ~~LOC~~ SOLN
6.0000 mg | Freq: Once | SUBCUTANEOUS | Status: AC
  Administered 2024-01-20: 6 mg via SUBCUTANEOUS

## 2024-01-20 NOTE — ED Provider Notes (Signed)
 [facility]    CSN: [id number] Arrival date & time: 01/20/24  0941      History   Chief Complaint Chief Complaint  Patient presents with   Migraine    HPI RAYANNE PADMANABHAN is a 38 y.o. female.  4-day history of headache with blurry vision, photosensitivity and nausea.  Head pain is currently rated 5/10.  She does have history of migraine with similar symptoms, but usually does not last this long. She used Excedrin this morning which helped. No head injury, neck stiffness, fever, weakness in the extremities or paresthesias.  Additionally reporting 4 days of white vaginal discharge.  No itching or odor.  Would like STD testing LMP 6/5  Hx uncontrolled HTN, used to be on triamterene -HCTZ daily, and labetalol  BID. Was stopped during her pregnancy in september 2024. She was put on nifedipine  at that time. Has not seen a primary care or had any workup since then. She took leftover nifedipine  a few days ago Does not have insurance  Past Medical History:  Diagnosis Date   Abnormal Pap smear    Bacterial vaginal infection    Bacterial vaginitis 10/24/2017   Chlamydia    Gonorrhea    Hypertension    Migraine    Pollen allergy    Yeast vaginitis     Patient Active Problem List   Diagnosis Date Noted   Morbid obesity (HCC) 05/05/2022   Low TSH level 05/05/2022   Primary hypertension 05/05/2022   Amenorrhea 05/05/2022   Skin lesion of face 05/05/2022   Bacterial vaginitis 10/24/2017   Chlamydia 08/18/2016    Past Surgical History:  Procedure Laterality Date   CESAREAN SECTION  2009   x 1    OB History     Gravida  3   Para  1   Term  1   Preterm      AB  1   Living  1      SAB  1   IAB      Ectopic      Multiple      Live Births  1            Home Medications    Prior to Admission medications   Medication Sig Start Date End Date Taking? Authorizing Provider  amLODipine (NORVASC) 10 MG tablet Take 1 tablet (10 mg total) by  mouth daily. 01/20/24  Yes Coleta Grosshans, Ivette Marks, PA-C  acetaminophen  (TYLENOL ) 325 MG tablet Take 2 tablets (650 mg total) by mouth every 6 (six) hours as needed for moderate pain. 04/13/23   Adolph Hoop, PA-C  cetirizine  (ZYRTEC  ALLERGY) 10 MG tablet Take 1 tablet (10 mg total) by mouth daily. 04/13/23   Adolph Hoop, PA-C    Family History Family History  Problem Relation Age of Onset   Asthma Mother    Diabetes Father    Hypertension Father     Social History Social History   Tobacco Use   Smoking status: Former    Current packs/day: 0.25    Average packs/day: 0.3 packs/day for 3.0 years (0.8 ttl pk-yrs)    Types: Cigarettes   Smokeless tobacco: Never  Vaping Use   Vaping status: Every Day  Substance Use Topics   Alcohol use: Not Currently    Comment: Occasional   Drug use: No    Comment: none     Allergies   Latex   Review of Systems Review of Systems  As per HPI  Physical Exam Triage Vital Signs  ED Triage Vitals  Encounter Vitals Group     BP 01/20/24 0949 (!) 161/107     Girls Systolic BP Percentile --      Girls Diastolic BP Percentile --      Boys Systolic BP Percentile --      Boys Diastolic BP Percentile --      Pulse Rate 01/20/24 0949 72     Resp 01/20/24 0949 16     Temp 01/20/24 0949 98.4 F (36.9 C)     Temp Source 01/20/24 0949 Oral     SpO2 01/20/24 0949 98 %     Weight --      Height --      Head Circumference --      Peak Flow --      Pain Score 01/20/24 0946 5     Pain Loc --      Pain Education --      Exclude from Growth Chart --    No data found.  Updated Vital Signs BP (!) 174/137 (BP Location: Right Arm)   Pulse 72   Temp 98.4 F (36.9 C) (Oral)   Resp 16   LMP 01/11/2024 Comment: 04/02/23 spotted x 1 day.  SpO2 98%    Physical Exam Vitals and nursing note reviewed.  Constitutional:      General: She is not in acute distress. HENT:     Head: Atraumatic.     Right Ear: Tympanic membrane and ear canal normal.     Left Ear:  Tympanic membrane and ear canal normal.     Mouth/Throat:     Mouth: Mucous membranes are moist.     Pharynx: Oropharynx is clear.   Eyes:     Extraocular Movements: Extraocular movements intact.     Conjunctiva/sclera: Conjunctivae normal.     Pupils: Pupils are equal, round, and reactive to light.    Cardiovascular:     Rate and Rhythm: Normal rate and regular rhythm.     Pulses: Normal pulses.     Heart sounds: Normal heart sounds.  Pulmonary:     Effort: Pulmonary effort is normal.     Breath sounds: Normal breath sounds.  Abdominal:     Tenderness: There is no abdominal tenderness.   Musculoskeletal:        General: Normal range of motion.     Cervical back: Normal range of motion. No rigidity.  Lymphadenopathy:     Cervical: No cervical adenopathy.   Skin:    General: Skin is warm and dry.     Capillary Refill: Capillary refill takes less than 2 seconds.   Neurological:     General: No focal deficit present.     Mental Status: She is alert and oriented to person, place, and time.     Cranial Nerves: Cranial nerves 2-12 are intact. No cranial nerve deficit.     Sensory: No sensory deficit.     Motor: No weakness or pronator drift.     Coordination: Romberg sign negative. Coordination normal. Finger-Nose-Finger Test and Heel to [facility] Test normal.     Gait: Gait normal.     Comments: Strength and sensation intact, equal. Negative Romberg and pronator drift. Normal finger to nose and heel to shin    UC Treatments / Results  Labs (all labs ordered are listed, but only abnormal results are displayed) Labs Reviewed  COMPREHENSIVE METABOLIC PANEL WITH GFR  CERVICOVAGINAL ANCILLARY ONLY    EKG  Radiology No results found.  Procedures  Procedures (including critical care time)  Medications Ordered in UC Medications  SUMAtriptan (IMITREX) injection 6 mg (6 mg Subcutaneous Given 01/20/24 1034)    Initial Impression / Assessment and Plan / UC Course  I have  reviewed the triage vital signs and the nursing notes.  Pertinent labs & imaging results that were available during my care of the patient were reviewed by me and considered in my medical decision making (see chart for details).  BP elevated 168/124 Discussion with patient this is worrisome given her headache and blurry vision currently.  She is neurologically intact otherwise.  I have advised giving IM pain medication and monitoring for improvement of headache and hopefully subsequently the blood pressure. After long discussion patient is agreeable.   Discussed urgent care metabolic panel will take about 2 or 3 days to return. Pending.   After IM sumatriptan, all of her symptoms have resolved. No further headache, dizziness, vision changes, blurry vision. She is feeling well. Unfortunately BP is still elevated, 174/137 She is now asymptomatic, and neurologically intact. Discussed I am comfortable sending her home if she will start once daily blood pressure medication, and call [facility] first thing Monday morning. Amlodipine 10 mg daily is prescribed. Patient verbalizes understanding. She also understands strict ED precautions.  Cytology swab pending. Treat positive result as indicated   Final Clinical Impressions(s) / UC Diagnoses   Final diagnoses:  Essential hypertension  Screen for STD (sexually transmitted disease)     Discharge Instructions      Start amlodipine 10 mg once daily every single day It may take about 2 weeks to have any effect on your blood pressure  We will call you in 2-3 days with any abnormality on your metabolic panel  Please go to the emergency department if you develop any new symptoms, especially headache, dizziness, vision changes, chest pain, shortness of breath, abdominal pain, weakness .  We will call you if anything on your swab returns positive. You can also see these results on MyChart. Please abstain from sexual  intercourse until your results return.     ED Prescriptions     Medication Sig Dispense Auth. Provider   amLODipine (NORVASC) 10 MG tablet Take 1 tablet (10 mg total) by mouth daily. 30 tablet Shamyra Farias, Ivette Marks, PA-C      PDMP not reviewed this encounter.   Mabelle Mungin, Ivette Marks, [place] 01/20/24 1145

## 2024-01-20 NOTE — Discharge Instructions (Signed)
 Start amlodipine 10 mg once daily every single day It may take about 2 weeks to have any effect on your blood pressure  We will call you in 2-3 days with any abnormality on your metabolic panel  Please go to the emergency department if you develop any new symptoms, especially headache, dizziness, vision changes, chest pain, shortness of breath, abdominal pain, weakness .  We will call you if anything on your swab returns positive. You can also see these results on MyChart. Please abstain from sexual intercourse until your results return.

## 2024-01-20 NOTE — ED Notes (Signed)
 I provided pt with the address and phone number of the [facility] and advised pt to call Monday to establish care and to be able to get refill of bp meds. Pt verbalized understanding

## 2024-01-20 NOTE — ED Triage Notes (Addendum)
 Pt c/o HA, nausea, blurred vision, photo sensitivityx4d. Pt stated she believes the HA is due to her bp and that she is out of her bp meds and does not have a PCP or insurance. Pt states took excedrine migraine and it has helped.  Pt c/o white vaginal dischargex4d

## 2024-01-21 ENCOUNTER — Ambulatory Visit: Payer: Self-pay

## 2024-01-21 LAB — COMPREHENSIVE METABOLIC PANEL WITH GFR
ALT: 18 IU/L (ref 0–32)
AST: 14 IU/L (ref 0–40)
Albumin: 4 g/dL (ref 3.9–4.9)
Alkaline Phosphatase: 83 IU/L (ref 44–121)
BUN/Creatinine Ratio: 13 (ref 9–23)
BUN: 8 mg/dL (ref 6–20)
Bilirubin Total: 0.4 mg/dL (ref 0.0–1.2)
CO2: 22 mmol/L (ref 20–29)
Calcium: 8.7 mg/dL (ref 8.7–10.2)
Chloride: 104 mmol/L (ref 96–106)
Creatinine, Ser: 0.61 mg/dL (ref 0.57–1.00)
Globulin, Total: 3.1 g/dL (ref 1.5–4.5)
Glucose: 92 mg/dL (ref 70–99)
Potassium: 4.3 mmol/L (ref 3.5–5.2)
Sodium: 140 mmol/L (ref 134–144)
Total Protein: 7.1 g/dL (ref 6.0–8.5)
eGFR: 119 mL/min/{1.73_m2} (ref >59)

## 2024-01-22 ENCOUNTER — Ambulatory Visit (HOSPITAL_COMMUNITY): Payer: Self-pay

## 2024-01-23 LAB — CERVICOVAGINAL ANCILLARY ONLY
Bacterial Vaginitis (gardnerella): POSITIVE — AB
Candida Glabrata: NEGATIVE
Candida Vaginitis: NEGATIVE
Chlamydia: NEGATIVE
Comment: NEGATIVE
Comment: NEGATIVE
Comment: NEGATIVE
Comment: NEGATIVE
Comment: NEGATIVE
Comment: NORMAL
Neisseria Gonorrhea: NEGATIVE
Trichomonas: NEGATIVE

## 2024-01-23 MED ORDER — METRONIDAZOLE 500 MG PO TABS
500.0000 mg | ORAL_TABLET | Freq: Two times a day (BID) | ORAL | 0 refills | Status: AC
Start: 2024-01-23 — End: 2024-01-30

## 2024-07-10 ENCOUNTER — Ambulatory Visit
Admission: EM | Admit: 2024-07-10 | Discharge: 2024-07-10 | Disposition: A | Discharge status: Home / Self Care | Payer: No Typology Code available for payment source | Attending: Family Medicine | Admitting: Family Medicine

## 2024-07-10 ENCOUNTER — Ambulatory Visit (INDEPENDENT_AMBULATORY_CARE_PROVIDER_SITE_OTHER): Payer: No Typology Code available for payment source

## 2024-07-10 ENCOUNTER — Other Ambulatory Visit: Payer: Self-pay

## 2024-07-10 DIAGNOSIS — Z113 Encounter for screening for infections with a predominantly sexual mode of transmission: Secondary | ICD-10-CM | POA: Insufficient documentation

## 2024-07-10 DIAGNOSIS — R350 Frequency of micturition: Secondary | ICD-10-CM | POA: Insufficient documentation

## 2024-07-10 DIAGNOSIS — B349 Viral infection, unspecified: Secondary | ICD-10-CM | POA: Insufficient documentation

## 2024-07-10 DIAGNOSIS — R051 Acute cough: Secondary | ICD-10-CM | POA: Diagnosis present

## 2024-07-10 DIAGNOSIS — I10 Essential (primary) hypertension: Secondary | ICD-10-CM | POA: Diagnosis present

## 2024-07-10 DIAGNOSIS — R0789 Other chest pain: Secondary | ICD-10-CM | POA: Insufficient documentation

## 2024-07-10 LAB — POCT URINE DIPSTICK
Bilirubin, UA: NEGATIVE
Blood, UA: NEGATIVE
Glucose, UA: NEGATIVE mg/dL
Ketones, POC UA: NEGATIVE mg/dL
Leukocytes, UA: NEGATIVE
Nitrite, UA: NEGATIVE
POC PROTEIN,UA: NEGATIVE
Spec Grav, UA: 1.01 (ref 1.010–1.025)
Urobilinogen, UA: 0.2 U/dL
pH, UA: 5.5 (ref 5.0–8.0)

## 2024-07-10 LAB — POC COVID19/FLU A&B COMBO
Covid Antigen, POC: NEGATIVE
Influenza A Antigen, POC: NEGATIVE
Influenza B Antigen, POC: NEGATIVE

## 2024-07-10 LAB — POCT URINE PREGNANCY: Preg Test, Ur: NEGATIVE

## 2024-07-10 MED ORDER — AMLODIPINE BESYLATE 10 MG PO TABS: 10.0000 mg | ORAL_TABLET | Freq: Every day | ORAL | 0 refills | Status: AC

## 2024-07-10 NOTE — Discharge Instructions (Addendum)
 The clinical contact you for any positive results of your STD testing done today.  I refilled your blood pressure medication for you to take daily.  Your EKG, chest x-ray urinalysis, and COVID flu test were negative.  You may use over-the-counter cough medicine and ibuprofen  as needed.  Monitor your symptoms closely and if you notice any worsening symptoms please go to the emergency room ASAP for further evaluation.  Follow-up with your PCP in 2 days for recheck.  I hope you feel better soon!

## 2024-07-10 NOTE — ED Triage Notes (Signed)
 Pt c/o non radiating left sided chest pain and HA started yesterday. Pt denies N/V, SOB

## 2024-07-10 NOTE — ED Provider Notes (Signed)
 [facility]    CSN: [id number] Arrival date & time: 07/10/24  1821      History   Chief Complaint No chief complaint on file.   HPI Jillian Rowland is a 38 y.o. female  presents for evaluation of URI symptoms for 1 days. Patient reports associated symptoms of cough and headache that started today with left-sided intermittent sharp chest pain that does not radiate that started yesterday. Denies N/V/D, fevers, congestion, shortness of breath, sore throat,, ear pain, body aches.  Does endorse some urinary frequency without dysuria.  No vaginal discharge but would like STD screening.  No known exposure.  Patient does not have a hx of asthma. Patient is his not an active smoker.   Reports she has not been taking her blood pressure medication for 5 days as she ran out.  She does report a lot of anxiety recently as she broke up with her boyfriend recently.  Pt has taken nothing OTC for symptoms.  She does not have chest pain at the time of evaluation.  Pt has no other concerns at this time.'   HPI  Past Medical History:  Diagnosis Date   Abnormal Pap smear    Bacterial vaginal infection    Bacterial vaginitis 10/24/2017   Chlamydia    Gonorrhea    Hypertension    Migraine    Pollen allergy    Yeast vaginitis     Patient Active Problem List   Diagnosis Date Noted   Morbid obesity (HCC) 05/05/2022   Low TSH level 05/05/2022   Primary hypertension 05/05/2022   Amenorrhea 05/05/2022   Skin lesion of face 05/05/2022   Bacterial vaginitis 10/24/2017   Chlamydia 08/18/2016    Past Surgical History:  Procedure Laterality Date   CESAREAN SECTION  2009   x 1    OB History     Gravida  3   Para  1   Term  1   Preterm      AB  1   Living  1      SAB  1   IAB      Ectopic      Multiple      Live Births  1            Home Medications    Prior to Admission medications   Medication Sig Start Date End Date Taking? Authorizing Provider   acetaminophen  (TYLENOL ) 325 MG tablet Take 2 tablets (650 mg total) by mouth every 6 (six) hours as needed for moderate pain. 04/13/23   Christopher Savannah, PA-C  amLODipine  (NORVASC ) 10 MG tablet Take 1 tablet (10 mg total) by mouth daily. 07/10/24   Indiana Pechacek, Jodi R, NP  cetirizine  (ZYRTEC  ALLERGY) 10 MG tablet Take 1 tablet (10 mg total) by mouth daily. 04/13/23   Christopher Savannah, PA-C    Family History Family History  Problem Relation Age of Onset   Asthma Mother    Diabetes Father    Hypertension Father     Social History Social History   Tobacco Use   Smoking status: Former    Current packs/day: 0.25    Average packs/day: 0.3 packs/day for 3.0 years (0.8 ttl pk-yrs)    Types: Cigarettes   Smokeless tobacco: Never  Vaping Use   Vaping status: Every Day  Substance Use Topics   Alcohol use: Not Currently    Comment: Occasional   Drug use: No    Comment: none     Allergies  Latex   Review of Systems Review of Systems  Constitutional:  Negative for fever.  HENT:  Negative for congestion and sore throat.   Respiratory:  Positive for cough.   Cardiovascular:  Positive for chest pain.  Genitourinary:  Positive for frequency. Negative for vaginal discharge.  Neurological:  Positive for headaches.     Physical Exam Triage Vital Signs ED Triage Vitals  Encounter Vitals Group     BP 07/10/24 1827 133/89     Girls Systolic BP Percentile --      Girls Diastolic BP Percentile --      Boys Systolic BP Percentile --      Boys Diastolic BP Percentile --      Pulse Rate 07/10/24 1827 90     Resp 07/10/24 1827 18     Temp 07/10/24 1827 98.1 F (36.7 C)     Temp Source 07/10/24 1827 Oral     SpO2 07/10/24 1827 98 %     Weight --      Height --      Head Circumference --      Peak Flow --      Pain Score 07/10/24 1825 7     Pain Loc --      Pain Education --      Exclude from Growth Chart --    No data found.  Updated Vital Signs BP 133/89   Pulse 90   Temp 98.1 F (36.7  C) (Oral)   Resp 18   LMP 04/30/2024   SpO2 98%   Visual Acuity Right Eye Distance:   Left Eye Distance:   Bilateral Distance:    Right Eye Near:   Left Eye Near:    Bilateral Near:     Physical Exam Vitals and nursing note reviewed.  Constitutional:      General: She is not in acute distress.    Appearance: She is well-developed. She is not ill-appearing.  HENT:     Head: Normocephalic and atraumatic.     Right Ear: Tympanic membrane and ear canal normal.     Left Ear: Tympanic membrane and ear canal normal.     Nose: No congestion.     Mouth/Throat:     Mouth: Mucous membranes are moist.     Pharynx: Oropharynx is clear. Uvula midline. No posterior oropharyngeal erythema.     Tonsils: No tonsillar exudate or tonsillar abscesses.  Eyes:     Conjunctiva/sclera: Conjunctivae normal.     Pupils: Pupils are equal, round, and reactive to light.  Cardiovascular:     Rate and Rhythm: Normal rate and regular rhythm.     Heart sounds: Normal heart sounds.  Pulmonary:     Effort: Pulmonary effort is normal.     Breath sounds: Normal breath sounds.  Chest:     Chest wall: Tenderness present. No swelling or crepitus.    Musculoskeletal:     Cervical back: Normal range of motion and neck supple.  Lymphadenopathy:     Cervical: No cervical adenopathy.  Skin:    General: Skin is warm and dry.  Neurological:     General: No focal deficit present.     Mental Status: She is alert and oriented to person, place, and time.  Psychiatric:        Mood and Affect: Mood normal.        Behavior: Behavior normal.     PERC Criteria for low probability for Pulmonary Embolism  Age <50 years  Heart  rate <100 beats/minute  Oxyhemoglobin saturation >=95 percent  No hemoptysis  No estrogen use  No prior DVT or PE  No unilateral leg swelling  No surgery/trauma requiring hospitalization within the prior four weeks  Total score: 0   Marburg score   Known clinical vascular  disease  Age/sex (F >=65 years or M >=55 years)  Increased pain with exercise  Pain not reproducible by palpation  Patient assumes pain is of cardiac origin  Score ranges from 0 to 5 points. One point for each answer 0-2 points: negative result 3-5 points: positive result  Total score: 0  Interchest score  History of CAD  Age/sex (F >=65 years or M >=55 years)  Increased pain with exercise  Pain reproducible by palpation (-1 if yes) Physician assumes cardiac origin  Pain feels like 'pressure'   Score ranges from ?1 to +5 points. One point for each answer except reproducible by palpation  <2 points: CAD negative 2-5 points: CAD positive   Total score: -1   UC Treatments / Results  Labs (all labs ordered are listed, but only abnormal results are displayed) Labs Reviewed  SYPHILIS: RPR W/REFLEX TO RPR TITER AND TREPONEMAL ANTIBODIES, TRADITIONAL SCREENING AND DIAGNOSIS ALGORITHM  HIV ANTIBODY (ROUTINE TESTING W REFLEX)  POCT URINE PREGNANCY  POCT URINE DIPSTICK  POC COVID19/FLU A&B COMBO  CERVICOVAGINAL ANCILLARY ONLY    EKG   Radiology No results found.  Procedures ED EKG  Date/Time: 07/10/2024 6:55 PM  Performed by: Loreda Myla SAUNDERS, NP Authorized by: Loreda Myla SAUNDERS, NP   ECG interpreted by ED Physician in the absence of a cardiologist: no   Previous ECG:    Previous ECG:  Compared to current   Similarity:  Changes noted Rate:    ECG rate:  85   ECG rate assessment: normal   Rhythm:    Rhythm: sinus rhythm   Ectopy:    Ectopy: none   QRS:    QRS axis:  Normal   QRS conduction: normal   ST segments:    ST segments:  Normal T waves:    T waves: normal   Q waves:    Abnormal Q-waves: not present    (including critical care time)  Medications Ordered in UC Medications - No data to display  Initial Impression / Assessment and Plan / UC Course  I have reviewed the triage vital signs and the nursing notes.  Pertinent labs & imaging results that were  available during my care of the patient were reviewed by me and considered in my medical decision making (see chart for details).     I reviewed exam and symptoms with patient.  EKG shows normal sinus rhythm with no acute changes.  UA and urine hCG negative.  Negative COVID and flu testing.  STD testing is ordered and will contact for any positive results.  Discussed likely viral illness and symptomatic treatment.  Patient PERC and Marburg and inter chest score is 0.  Discussed likely musculoskeletal cause and low suspicion for cardiac origin.  Patient does not have any chest pain at time of evaluation.  I did refill patient's Norvasc  for 30 days and advise she needs to see a PCP for additional refills.  Patient can use over-the-counter cough medicine and encourage rest fluids and PCP follow-up in 2 days for recheck.  Strict ER precautions reviewed and patient verbalized understanding. Final Clinical Impressions(s) / UC Diagnoses   Final diagnoses:  Acute cough  Screening examination for STD (sexually  transmitted disease)  Urinary frequency  Viral illness  Musculoskeletal chest pain  Essential hypertension     Discharge Instructions      The clinical contact you for any positive results of your STD testing done today.  I refilled your blood pressure medication for you to take daily.  Your EKG, chest x-ray urinalysis, and COVID flu test were negative.  You may use over-the-counter cough medicine and ibuprofen  as needed.  Monitor your symptoms closely and if you notice any worsening symptoms please go to the emergency room ASAP for further evaluation.  Follow-up with your PCP in 2 days for recheck.  I hope you feel better soon!     ED Prescriptions     Medication Sig Dispense Auth. Provider   amLODipine  (NORVASC ) 10 MG tablet Take 1 tablet (10 mg total) by mouth daily. 30 tablet Ketra Duchesne, Jodi R, NP      PDMP not reviewed this encounter.   Loreda Myla SAUNDERS, NP 07/10/24 [phone]

## 2024-07-11 ENCOUNTER — Ambulatory Visit: Payer: Self-pay | Admitting: Nurse Practitioner

## 2024-07-11 LAB — CERVICOVAGINAL ANCILLARY ONLY
Chlamydia: NEGATIVE
Comment: NEGATIVE
Comment: NEGATIVE
Comment: NORMAL
Neisseria Gonorrhea: NEGATIVE
Trichomonas: NEGATIVE

## 2024-07-12 LAB — HIV ANTIBODY (ROUTINE TESTING W REFLEX): HIV Screen 4th Generation wRfx: NONREACTIVE

## 2024-07-12 LAB — SYPHILIS: RPR W/REFLEX TO RPR TITER AND TREPONEMAL ANTIBODIES, TRADITIONAL SCREENING AND DIAGNOSIS ALGORITHM: RPR Ser Ql: NONREACTIVE

## 2024-08-09 ENCOUNTER — Ambulatory Visit: Admitting: Family Medicine

## 2024-08-09 NOTE — Progress Notes (Signed)
" ° °  Subjective:    Patient ID: Jillian Rowland, female    DOB: Apr 17, 1986, 39 y.o.   MRN: [id number]  HPI  She was not seen. She will follow up with PCP  Review of Systems     Objective:   Physical Exam        Assessment & Plan:    "

## 2024-09-04 ENCOUNTER — Ambulatory Visit: Admitting: Internal Medicine

## 2024-09-06 ENCOUNTER — Ambulatory Visit: Payer: Self-pay

## 2024-09-06 ENCOUNTER — Ambulatory Visit (INDEPENDENT_AMBULATORY_CARE_PROVIDER_SITE_OTHER): Admitting: Family Medicine

## 2024-09-06 ENCOUNTER — Telehealth: Payer: Self-pay

## 2024-09-06 ENCOUNTER — Other Ambulatory Visit: Payer: Self-pay

## 2024-09-06 ENCOUNTER — Encounter: Payer: Self-pay | Admitting: Family Medicine

## 2024-09-06 VITALS — BP 148/90 | HR 88 | Temp 98.5°F | Ht 65.0 in | Wt 231.2 lb

## 2024-09-06 DIAGNOSIS — M25469 Effusion, unspecified knee: Secondary | ICD-10-CM | POA: Diagnosis not present

## 2024-09-06 DIAGNOSIS — I10 Essential (primary) hypertension: Secondary | ICD-10-CM

## 2024-09-06 NOTE — Telephone Encounter (Signed)
 Copied from CRM [id number]. Topic: Clinical - Medication Question >> Sep 06, 2024  4:45 PM China J wrote: Reason for CRM: The patient was prescribed amLODipine  (NORVASC ) 10 MG tablet from urgent care. She is now out of medication and is wanting to know if Dr. Joshua is needing to see her for a refill, or if this is something he could fill without an appointment.

## 2024-09-06 NOTE — Progress Notes (Signed)
 "  Established Patient Office Visit   Subjective  Patient ID: Jillian Rowland, female    DOB: [birth year]  Age: 39 y.o. MRN: [id number]  Chief Complaint  Patient presents with   Acute Visit    Patient came in today for right leg and ankle swelling and tightness, started 1 day ago, patient denies any pain, patient was exercising last night     Jillian Rowland is a 39 yo female followed by Debby Molt who is seen for acute concern.  Jillian Rowland states Jillian Rowland was scheduled on Wednesday but missed the appointment.  When called to reschedule this appointment was scheduled but Jillian Rowland wasn't sure why.  Jillian Rowland states right knee feels tight/slightly swollen.  Started this am.  Notes discomfort in back of knee/top of calf.  Exercised yesterday doing squats and leg presses.  Working out for the last 3 weeks.  Denies edema of calf, SOB, lower leg or pedal edema.  Jillian Rowland states Jillian Rowland has been out of bp med.  States Norvasc  was started by provider at [facility] several months ago.     Patient Active Problem List   Diagnosis Date Noted   Morbid obesity (HCC) 05/05/2022   Low TSH level 05/05/2022   Primary hypertension 05/05/2022   Amenorrhea 05/05/2022   Skin lesion of face 05/05/2022   Bacterial vaginitis 10/24/2017   Chlamydia 08/18/2016   Past Medical History:  Diagnosis Date   Abnormal Pap smear    Bacterial vaginal infection    Bacterial vaginitis 10/24/2017   Chlamydia    Gonorrhea    Hypertension    Migraine    Pollen allergy    Yeast vaginitis    Past Surgical History:  Procedure Laterality Date   CESAREAN SECTION  2009   x 1   Social History[1] Family History  Problem Relation Age of Onset   Asthma Mother    Diabetes Father    Hypertension Father    Allergies[2]  ROS Negative unless stated above    Objective:     BP (!) 148/90 (BP Location: Left Arm, Patient Position: Sitting, Cuff Size: Large)   Pulse 88   Temp 98.5 F (36.9 C) (Oral)   Ht 5' 5 (1.651 m)   Wt 231 lb 3.2 oz (104.9 kg)   SpO2 98%    BMI 38.47 kg/m  BP Readings from Last 3 Encounters:  09/06/24 (!) 148/90  07/10/24 133/89  01/20/24 (!) 174/137   Wt Readings from Last 3 Encounters:  09/06/24 231 lb 3.2 oz (104.9 kg)  08/17/23 234 lb (106.1 kg)  04/15/23 232 lb 8 oz (105.5 kg)      Physical Exam Constitutional:      General: Jillian Rowland is not in acute distress.    Appearance: Normal appearance.  HENT:     Head: Normocephalic and atraumatic.     Nose: Nose normal.     Mouth/Throat:     Mouth: Mucous membranes are moist.  Cardiovascular:     Rate and Rhythm: Normal rate and regular rhythm.     Heart sounds: Normal heart sounds. No murmur heard.    No gallop.  Pulmonary:     Effort: Pulmonary effort is normal. No respiratory distress.     Breath sounds: Normal breath sounds. No wheezing, rhonchi or rales.  Musculoskeletal:     Right knee: Crepitus present. No swelling, deformity, effusion or bony tenderness. Normal range of motion. No tenderness.     Left knee: No swelling, deformity, effusion, bony tenderness or crepitus. Normal range  of motion. No tenderness.     Right lower leg: No swelling. No edema.     Left lower leg: No swelling. No edema.     Comments: No edema of LEs.  Crepitus of R knee.  Negative Homans' sign.  Skin:    General: Skin is warm and dry.  Neurological:     Mental Status: Jillian Rowland is alert and oriented to person, place, and time.        08/26/2022    8:32 AM 05/05/2022    1:39 PM  Depression screen PHQ 2/9  Decreased Interest 0 0  Down, Depressed, Hopeless 0 0  PHQ - 2 Score 0 0  Altered sleeping  0  Tired, decreased energy  0  Change in appetite  0  Feeling bad or failure about yourself   0  Trouble concentrating  0  Moving slowly or fidgety/restless  0  Suicidal thoughts  0  PHQ-9 Score  0   Difficult doing work/chores  Not difficult at all     Data saved with a previous flowsheet row definition       No data to display           No results found for any visits on  09/06/24.    Assessment & Plan:   Edema of knee  Essential hypertension  Acute tightness of R knee s/p leg workout. No palpable effusion, deformity, edema or knee or lower legs b/l.  Discussed possible causes of symptoms.  Discomfort likely from overuse.  Discussed supportive care including compression, ice, rest, elevation, Tylenol  or NSAIDs.  Advised use NSAIDs sparingly given elevated BP.  Recheck BP.  Restart Norvasc  10 mg daily.  Rx request sent.  Lifestyle modifications encouraged.  Patient advised to follow-up with PCP in the next few weeks or for continued symptoms.  Return if symptoms worsen or fail to improve.   Clotilda JONELLE Single, MD     [1]  Social History Tobacco Use   Smoking status: Former    Current packs/day: 0.25    Average packs/day: 0.3 packs/day for 3.0 years (0.8 ttl pk-yrs)    Types: Cigarettes   Smokeless tobacco: Never  Vaping Use   Vaping status: Every Day  Substance Use Topics   Alcohol use: Not Currently    Comment: Occasional   Drug use: No    Comment: none  [2]  Allergies Allergen Reactions   Latex     Causes yeast infection   "

## 2024-09-06 NOTE — Telephone Encounter (Signed)
 FYI Only or Action Required?: FYI only for provider: appointment scheduled on 09/06/24.  Patient was last seen in primary care on 08/26/2022 by Lendia Boby CROME, NP-C.  Called Nurse Triage reporting Joint Swelling.  Symptoms began today.  Interventions attempted: Nothing.  Symptoms are: gradually worsening.  Triage Disposition: See HCP Within 4 Hours (Or PCP Triage)  Patient/caregiver understands and will follow disposition?:  Reason for Disposition  SEVERE leg swelling (e.g., swelling extends above knee, entire leg is swollen, weeping fluid)  Answer Assessment - Initial Assessment Questions Pt reports right leg edema since this morning from thigh to ankle, states it feels tight. Denies redness or pain other than tightness. Pt went to the gym yesterday. Denies SOB or CP. Advised ED if that occurs.   1. ONSET: When did the swelling start? (e.g., minutes, hours, days)     Today 2. LOCATION: What part of the leg is swollen?  Are both legs swollen or just one leg?     Right leg 4. REDNESS: Is there redness or signs of infection?     Yes, states is red 5. PAIN: Is the swelling painful to touch? If Yes, ask: How painful is it?   (Scale 1-10; mild, moderate or severe)     States feels tight 6. FEVER: Do you have a fever? If Yes, ask: What is it, how was it measured, and when did it start?      Denies 8. MEDICAL HISTORY: Do you have a history of blood clots (e.g., DVT), cancer, heart failure, kidney disease, or liver failure?     Denies, not currently on BCP 10. OTHER SYMPTOMS: Do you have any other symptoms? (e.g., chest pain, difficulty breathing)       Denies  Protocols used: Leg Swelling and Edema-A-AH

## 2024-09-09 ENCOUNTER — Telehealth: Payer: Self-pay

## 2024-09-09 ENCOUNTER — Encounter: Admitting: Family

## 2024-09-09 NOTE — Telephone Encounter (Signed)
 This will need to be sent to her current PCP. I do not fill Rx's until patient has established care with me.

## 2024-09-10 NOTE — Telephone Encounter (Signed)
 Unable to reach patient. Left a very detailed message about us  not being able to refill her medication because she hasn't been seen and also that her new PCP doesn't refill medication until the patient is established.

## 2024-10-08 ENCOUNTER — Encounter: Admitting: Internal Medicine
